# Patient Record
Sex: Female | Born: 1985 | Race: Black or African American | Hispanic: No | Marital: Single | Smoking: Never smoker
Health system: Southern US, Community
[De-identification: ages and names within clinical notes are randomized; demographics above are authoritative.]

## PROBLEM LIST (undated history)

## (undated) ENCOUNTER — Emergency Department (HOSPITAL_COMMUNITY): Admission: EM | Payer: No Typology Code available for payment source | Source: Home / Self Care

---

## 2008-04-25 ENCOUNTER — Ambulatory Visit: Payer: Self-pay | Admitting: Family Medicine

## 2008-04-25 DIAGNOSIS — J0110 Acute frontal sinusitis, unspecified: Secondary | ICD-10-CM

## 2009-06-19 ENCOUNTER — Encounter: Payer: Self-pay | Admitting: Family Medicine

## 2009-06-20 ENCOUNTER — Ambulatory Visit: Payer: Self-pay | Admitting: Family Medicine

## 2009-06-20 DIAGNOSIS — R350 Frequency of micturition: Secondary | ICD-10-CM

## 2009-06-20 LAB — CONVERTED CEMR LAB
Bilirubin Urine: NEGATIVE
Glucose, Urine, Semiquant: NEGATIVE
Specific Gravity, Urine: 1.015
Urobilinogen, UA: 0.2

## 2011-06-27 ENCOUNTER — Emergency Department (HOSPITAL_COMMUNITY)
Admission: EM | Admit: 2011-06-27 | Discharge: 2011-06-28 | Disposition: A | Discharge status: Home / Self Care | Payer: No Typology Code available for payment source | Attending: Emergency Medicine | Admitting: Emergency Medicine

## 2011-06-27 DIAGNOSIS — T148XXA Other injury of unspecified body region, initial encounter: Secondary | ICD-10-CM | POA: Insufficient documentation

## 2011-06-27 DIAGNOSIS — M25519 Pain in unspecified shoulder: Secondary | ICD-10-CM | POA: Insufficient documentation

## 2011-06-27 DIAGNOSIS — M546 Pain in thoracic spine: Secondary | ICD-10-CM | POA: Insufficient documentation

## 2012-05-22 ENCOUNTER — Encounter (HOSPITAL_COMMUNITY): Payer: Self-pay | Admitting: *Deleted

## 2012-05-22 ENCOUNTER — Emergency Department (HOSPITAL_COMMUNITY)
Admission: EM | Admit: 2012-05-22 | Discharge: 2012-05-22 | Disposition: A | Discharge status: Home / Self Care | Payer: BC Managed Care – PPO | Attending: Emergency Medicine | Admitting: Emergency Medicine

## 2012-05-22 DIAGNOSIS — R1110 Vomiting, unspecified: Secondary | ICD-10-CM | POA: Insufficient documentation

## 2012-05-22 DIAGNOSIS — R197 Diarrhea, unspecified: Secondary | ICD-10-CM

## 2012-05-22 DIAGNOSIS — R109 Unspecified abdominal pain: Secondary | ICD-10-CM | POA: Insufficient documentation

## 2012-05-22 LAB — DIFFERENTIAL
Basophils Relative: 0 % (ref 0–1)
Lymphs Abs: 0.9 10*3/uL (ref 0.7–4.0)
Monocytes Relative: 4 % (ref 3–12)
Neutro Abs: 5.5 10*3/uL (ref 1.7–7.7)
Neutrophils Relative %: 83 % — ABNORMAL HIGH (ref 43–77)

## 2012-05-22 LAB — CBC
Hemoglobin: 13 g/dL (ref 12.0–15.0)
Platelets: 290 10*3/uL (ref 150–400)
RBC: 4.87 MIL/uL (ref 3.87–5.11)
WBC: 6.6 10*3/uL (ref 4.0–10.5)

## 2012-05-22 LAB — URINALYSIS, ROUTINE W REFLEX MICROSCOPIC
Bilirubin Urine: NEGATIVE
Hgb urine dipstick: NEGATIVE
Ketones, ur: NEGATIVE mg/dL
Nitrite: NEGATIVE
pH: 7.5 (ref 5.0–8.0)

## 2012-05-22 LAB — COMPREHENSIVE METABOLIC PANEL
ALT: 12 U/L (ref 0–35)
Albumin: 4.2 g/dL (ref 3.5–5.2)
Alkaline Phosphatase: 63 U/L (ref 39–117)
BUN: 7 mg/dL (ref 6–23)
Chloride: 101 mEq/L (ref 96–112)
Glucose, Bld: 99 mg/dL (ref 70–99)
Potassium: 3.8 mEq/L (ref 3.5–5.1)
Sodium: 138 mEq/L (ref 135–145)
Total Bilirubin: 0.3 mg/dL (ref 0.3–1.2)

## 2012-05-22 LAB — LIPASE, BLOOD: Lipase: 51 U/L (ref 11–59)

## 2012-05-22 MED ORDER — ONDANSETRON 8 MG PO TBDP: 8.0000 mg | ORAL_TABLET | Freq: Three times a day (TID) | ORAL | Status: AC | PRN

## 2012-05-22 MED ORDER — SODIUM CHLORIDE 0.9 % IV SOLN
1000.0000 mL | Freq: Once | INTRAVENOUS | Status: AC
  Administered 2012-05-22: 1000 mL via INTRAVENOUS

## 2012-05-22 MED ORDER — SODIUM CHLORIDE 0.9 % IV SOLN
1000.0000 mL | INTRAVENOUS | Status: DC
  Administered 2012-05-22: 1000 mL via INTRAVENOUS

## 2012-05-22 MED ORDER — DICYCLOMINE HCL 20 MG PO TABS: 20.0000 mg | ORAL_TABLET | Freq: Four times a day (QID) | ORAL | Status: DC | PRN

## 2012-05-22 MED ORDER — ONDANSETRON HCL 4 MG/2ML IJ SOLN
4.0000 mg | Freq: Once | INTRAMUSCULAR | Status: AC
  Administered 2012-05-22: 4 mg via INTRAVENOUS
  Filled 2012-05-22: qty 2

## 2012-05-22 MED ORDER — MORPHINE SULFATE 4 MG/ML IJ SOLN
4.0000 mg | Freq: Once | INTRAMUSCULAR | Status: AC
  Administered 2012-05-22: 4 mg via INTRAVENOUS
  Filled 2012-05-22: qty 1

## 2012-05-22 NOTE — ED Provider Notes (Signed)
History     CSN: [id number]  Arrival date & time 05/22/12  1120   First MD Initiated Contact with Patient 05/22/12 1155      Chief Complaint  Patient presents with  . Nausea  . Emesis  . Diarrhea    Patient is a 26 y.o. female presenting with vomiting and diarrhea. The history is provided by the patient.  Emesis  This is a recurrent problem. The current episode started 6 to 12 hours ago. The problem occurs 5 to 10 times per day. The problem has not changed since onset.The emesis has an appearance of stomach contents. There has been no fever. Associated symptoms include abdominal pain and diarrhea. Pertinent negatives include no cough.  Diarrhea The primary symptoms include abdominal pain, vomiting and diarrhea.  The diarrhea is watery. The diarrhea occurs 5 to 10 times per day.  SHe also has been having sharp pain in her lower abdomen associated with this vomiting and diarrhea.  At times it is severe.  Kaopectate has helped.  No foreign travel.  No abx.    Pt has had similar events in the past.  She has never gone to see a doctor.  History reviewed. No pertinent past medical history.  History reviewed. No pertinent past surgical history.  No family history on file.  History  Substance Use Topics  . Smoking status: Never Smoker   . Smokeless tobacco: Not on file  . Alcohol Use: Yes     "few drinks/week"    OB History    Grav Para Term Preterm Abortions TAB SAB Ect Mult Living                  Review of Systems  Respiratory: Negative for cough.   Gastrointestinal: Positive for vomiting, abdominal pain and diarrhea.  All other systems reviewed and are negative.    Allergies  Review of patient's allergies indicates no known allergies.  Home Medications   Current Outpatient Rx  Name Route Sig Dispense Refill  . BISMUTH SUBSALICYLATE 262 MG/15ML PO SUSP Oral Take 15 mLs by mouth every 6 (six) hours as needed. For nausea or diaherra      LMP 05/01/2012  Physical  Exam  Nursing note and vitals reviewed. Constitutional: She appears well-developed and well-nourished. No distress.  HENT:  Head: Normocephalic and atraumatic.  Right Ear: External ear normal.  Left Ear: External ear normal.  Eyes: Conjunctivae are normal. Right eye exhibits no discharge. Left eye exhibits no discharge. No scleral icterus.  Neck: Neck supple. No tracheal deviation present.  Cardiovascular: Normal rate, regular rhythm and intact distal pulses.   Pulmonary/Chest: Effort normal and breath sounds normal. No stridor. No respiratory distress. She has no wheezes. She has no rales.  Abdominal: Soft. Bowel sounds are normal. She exhibits no distension. There is tenderness (mild ttp lower abdomen greater on left). There is no rebound and no guarding.  Musculoskeletal: She exhibits no edema and no tenderness.  Neurological: She is alert. She has normal strength. No sensory deficit. Cranial nerve deficit:  no gross defecits noted. She exhibits normal muscle tone. She displays no seizure activity. Coordination normal.  Skin: Skin is warm and dry. No rash noted.  Psychiatric: She has a normal mood and affect.    ED Course  Procedures (including critical care time)  Labs Reviewed  DIFFERENTIAL - Abnormal; Notable for the following:    Neutrophils Relative 83 (*)     All other components within normal limits  URINALYSIS, ROUTINE  W REFLEX MICROSCOPIC - Abnormal; Notable for the following:    APPearance CLOUDY (*)     All other components within normal limits  CBC  COMPREHENSIVE METABOLIC PANEL  LIPASE, BLOOD  POCT PREGNANCY, URINE   No results found.   1. Vomiting and diarrhea       MDM  Patient feels better after treatment with IV fluids, antiemetics and pain medications. On repeat exam she is no tenderness to palpation the lower abdomen. She describes having recurrent episodes of this type of discomfort it is possible this could be a condition such as irritable bowel. at  this time I have a low suspicion for appendicitis. Patient be discharged home with medications for nausea intestinal colic. She is to return to emergency room for fever worsening symptoms       Celene Kras, MD 05/22/12 1500

## 2012-05-22 NOTE — Discharge Instructions (Signed)
Nausea and Vomiting  Nausea is a sick feeling that often comes before throwing up (vomiting). Vomiting is a reflex where stomach contents come out of your mouth. Vomiting can cause severe loss of body fluids (dehydration). Children and elderly adults can become dehydrated quickly, especially if they also have diarrhea. Nausea and vomiting are symptoms of a condition or disease. It is important to find the cause of your symptoms.  CAUSES    Direct irritation of the stomach lining. This irritation can result from increased acid production (gastroesophageal reflux disease), infection, food poisoning, taking certain medicines (such as nonsteroidal anti-inflammatory drugs), alcohol use, or tobacco use.   Signals from the brain.These signals could be caused by a headache, heat exposure, an inner ear disturbance, increased pressure in the brain from injury, infection, a tumor, or a concussion, pain, emotional stimulus, or metabolic problems.   An obstruction in the gastrointestinal tract (bowel obstruction).   Illnesses such as diabetes, hepatitis, gallbladder problems, appendicitis, kidney problems, cancer, sepsis, atypical symptoms of a heart attack, or eating disorders.   Medical treatments such as chemotherapy and radiation.   Receiving medicine that makes you sleep (general anesthetic) during surgery.  DIAGNOSIS  Your caregiver may ask for tests to be done if the problems do not improve after a few days. Tests may also be done if symptoms are severe or if the reason for the nausea and vomiting is not clear. Tests may include:   Urine tests.   Blood tests.   Stool tests.   Cultures (to look for evidence of infection).   X-rays or other imaging studies.  Test results can help your caregiver make decisions about treatment or the need for additional tests.  TREATMENT  You need to stay well hydrated. Drink frequently but in small amounts.You may wish to drink water, sports drinks, clear broth, or eat frozen  ice pops or gelatin dessert to help stay hydrated.When you eat, eating slowly may help prevent nausea.There are also some antinausea medicines that may help prevent nausea.  HOME CARE INSTRUCTIONS    Take all medicine as directed by your caregiver.   If you do not have an appetite, do not force yourself to eat. However, you must continue to drink fluids.   If you have an appetite, eat a normal diet unless your caregiver tells you differently.   Eat a variety of complex carbohydrates (rice, wheat, potatoes, bread), lean meats, yogurt, fruits, and vegetables.   Avoid high-fat foods because they are more difficult to digest.   Drink enough water and fluids to keep your urine clear or pale yellow.   If you are dehydrated, ask your caregiver for specific rehydration instructions. Signs of dehydration may include:   Severe thirst.   Dry lips and mouth.   Dizziness.   Dark urine.   Decreasing urine frequency and amount.   Confusion.   Rapid breathing or pulse.  SEEK IMMEDIATE MEDICAL CARE IF:    You have blood or brown flecks (like coffee grounds) in your vomit.   You have black or bloody stools.   You have a severe headache or stiff neck.   You are confused.   You have severe abdominal pain.   You have chest pain or trouble breathing.   You do not urinate at least once every 8 hours.   You develop cold or clammy skin.   You continue to vomit for longer than 24 to 48 hours.   You have a fever.  MAKE SURE YOU:      Understand these instructions.   Will watch your condition.   Will get help right away if you are not doing well or get worse.  Document Released: 11/15/2005 Document Revised: 11/04/2011 Document Reviewed: 04/14/2011  [employer] Patient Information 2012 [employer].

## 2012-05-22 NOTE — ED Notes (Signed)
Pt reports n/v/d since 2am today. Unable to verbalize amt. C/o abd pain, mid and lower, aching.

## 2013-02-07 ENCOUNTER — Encounter: Payer: Self-pay | Admitting: Gastroenterology

## 2013-02-23 ENCOUNTER — Ambulatory Visit: Payer: BC Managed Care – PPO | Admitting: Gastroenterology

## 2013-11-08 ENCOUNTER — Emergency Department: Payer: Self-pay | Admitting: Emergency Medicine

## 2013-11-08 LAB — COMPREHENSIVE METABOLIC PANEL
Alkaline Phosphatase: 75 U/L
Bilirubin,Total: 0.4 mg/dL (ref 0.2–1.0)
Calcium, Total: 9.9 mg/dL (ref 8.5–10.1)
Chloride: 103 mmol/L (ref 98–107)
Creatinine: 0.91 mg/dL (ref 0.60–1.30)
Glucose: 96 mg/dL (ref 65–99)
Osmolality: 274 (ref 275–301)
SGPT (ALT): 55 U/L (ref 12–78)

## 2013-11-08 LAB — CBC WITH DIFFERENTIAL/PLATELET
Basophil #: 0 10*3/uL
Basophil %: 0.8 %
Eosinophil #: 0 10*3/uL
Eosinophil %: 0.3 %
HCT: 42.2 %
HGB: 13.8 g/dL
Lymphocyte %: 31.1 %
Lymphs Abs: 1.9 10*3/uL
MCH: 27.4 pg
MCHC: 32.6 g/dL
MCV: 84 fL
Monocyte #: 0.3 10*3/uL
Monocyte %: 4.1 %
Neutrophil #: 4 10*3/uL
Neutrophil %: 63.7 %
Platelet: 311 10*3/uL
RBC: 5.04 X10 6/mm 3
RDW: 12.5 %
WBC: 6.2 10*3/uL

## 2013-11-08 LAB — URINALYSIS, COMPLETE
Blood: NEGATIVE
Ph: 8 (ref 4.5–8.0)
Protein: NEGATIVE
WBC UR: 7 /HPF (ref 0–5)

## 2013-11-08 LAB — WET PREP, GENITAL

## 2013-11-08 LAB — LIPASE, BLOOD: Lipase: 236 U/L (ref 73–393)

## 2013-11-15 ENCOUNTER — Emergency Department: Payer: Self-pay | Admitting: Emergency Medicine

## 2013-11-15 LAB — URINALYSIS, COMPLETE
Bilirubin,UR: NEGATIVE
Blood: NEGATIVE
Glucose,UR: NEGATIVE mg/dL (ref 0–75)
Nitrite: NEGATIVE
Ph: 6 (ref 4.5–8.0)
Squamous Epithelial: 10
WBC UR: 177 /HPF (ref 0–5)

## 2014-09-09 ENCOUNTER — Other Ambulatory Visit: Payer: Self-pay | Admitting: Family Medicine

## 2014-09-09 DIAGNOSIS — R1084 Generalized abdominal pain: Secondary | ICD-10-CM

## 2014-09-24 ENCOUNTER — Other Ambulatory Visit: Payer: Self-pay | Admitting: Family Medicine

## 2014-10-10 ENCOUNTER — Other Ambulatory Visit: Payer: BC Managed Care – PPO

## 2016-01-29 ENCOUNTER — Emergency Department (HOSPITAL_COMMUNITY)
Admission: EM | Admit: 2016-01-29 | Discharge: 2016-01-29 | Disposition: A | Discharge status: Home / Self Care | Payer: BC Managed Care – PPO | Attending: Emergency Medicine | Admitting: Emergency Medicine

## 2016-01-29 ENCOUNTER — Encounter (HOSPITAL_COMMUNITY): Payer: Self-pay | Admitting: Emergency Medicine

## 2016-01-29 DIAGNOSIS — J029 Acute pharyngitis, unspecified: Secondary | ICD-10-CM | POA: Diagnosis not present

## 2016-01-29 DIAGNOSIS — R0789 Other chest pain: Secondary | ICD-10-CM | POA: Diagnosis not present

## 2016-01-29 DIAGNOSIS — R197 Diarrhea, unspecified: Secondary | ICD-10-CM | POA: Insufficient documentation

## 2016-01-29 DIAGNOSIS — R05 Cough: Secondary | ICD-10-CM | POA: Diagnosis present

## 2016-01-29 NOTE — ED Notes (Signed)
x3 no answer

## 2016-01-29 NOTE — ED Notes (Signed)
Called for pt 2x, no response 

## 2016-01-29 NOTE — ED Notes (Signed)
Pt c/o headache, rhinorrhea, sore throat, weakness, sneezing, diarrhea. Pt reports chest wall pain during cough or sneezing only, no pain at rest, no pain now.

## 2016-05-11 ENCOUNTER — Emergency Department (HOSPITAL_COMMUNITY)
Admission: EM | Admit: 2016-05-11 | Discharge: 2016-05-11 | Disposition: A | Discharge status: Home / Self Care | Payer: BC Managed Care – PPO | Source: Home / Self Care

## 2016-05-11 ENCOUNTER — Encounter (HOSPITAL_COMMUNITY): Payer: Self-pay | Admitting: *Deleted

## 2016-05-11 ENCOUNTER — Inpatient Hospital Stay (HOSPITAL_COMMUNITY)
Admission: AD | Admit: 2016-05-11 | Discharge: 2016-05-11 | Disposition: A | Discharge status: Home / Self Care | Payer: BC Managed Care – PPO | Source: Ambulatory Visit | Attending: Obstetrics and Gynecology | Admitting: Obstetrics and Gynecology

## 2016-05-11 ENCOUNTER — Encounter (HOSPITAL_COMMUNITY): Payer: Self-pay | Admitting: Emergency Medicine

## 2016-05-11 DIAGNOSIS — R1084 Generalized abdominal pain: Secondary | ICD-10-CM | POA: Diagnosis not present

## 2016-05-11 DIAGNOSIS — R109 Unspecified abdominal pain: Secondary | ICD-10-CM | POA: Diagnosis not present

## 2016-05-11 DIAGNOSIS — Z3202 Encounter for pregnancy test, result negative: Secondary | ICD-10-CM | POA: Insufficient documentation

## 2016-05-11 DIAGNOSIS — R1010 Upper abdominal pain, unspecified: Secondary | ICD-10-CM

## 2016-05-11 DIAGNOSIS — Z5321 Procedure and treatment not carried out due to patient leaving prior to being seen by health care provider: Secondary | ICD-10-CM | POA: Insufficient documentation

## 2016-05-11 LAB — COMPREHENSIVE METABOLIC PANEL
ALBUMIN: 4.2 g/dL (ref 3.5–5.0)
ALK PHOS: 70 U/L (ref 38–126)
ALT: 31 U/L (ref 14–54)
AST: 23 U/L (ref 15–41)
Anion gap: 8 (ref 5–15)
BUN: 8 mg/dL (ref 6–20)
CALCIUM: 9.4 mg/dL (ref 8.9–10.3)
CO2: 25 mmol/L (ref 22–32)
CREATININE: 0.96 mg/dL (ref 0.44–1.00)
Chloride: 101 mmol/L (ref 101–111)
GFR calc Af Amer: >60 mL/min (ref >60)
GFR calc non Af Amer: >60 mL/min (ref >60)
GLUCOSE: 109 mg/dL — AB (ref 65–99)
Potassium: 3.7 mmol/L (ref 3.5–5.1)
Sodium: 134 mmol/L — ABNORMAL LOW (ref 135–145)
Total Bilirubin: 0.6 mg/dL (ref 0.3–1.2)
Total Protein: 7.5 g/dL (ref 6.5–8.1)

## 2016-05-11 LAB — CBC WITH DIFFERENTIAL/PLATELET
BASOS ABS: 0 10*3/uL (ref 0.0–0.1)
BASOS PCT: 0 %
EOS ABS: 0.1 10*3/uL (ref 0.0–0.7)
EOS PCT: 2 %
HCT: 35.9 % — ABNORMAL LOW (ref 36.0–46.0)
Hemoglobin: 11.8 g/dL — ABNORMAL LOW (ref 12.0–15.0)
Lymphocytes Relative: 29 %
Lymphs Abs: 2.2 10*3/uL (ref 0.7–4.0)
MCH: 26.4 pg (ref 26.0–34.0)
MCHC: 32.9 g/dL (ref 30.0–36.0)
MCV: 80.3 fL (ref 78.0–100.0)
MONO ABS: 0.4 10*3/uL (ref 0.1–1.0)
Monocytes Relative: 6 %
Neutro Abs: 4.8 10*3/uL (ref 1.7–7.7)
Neutrophils Relative %: 63 %
PLATELETS: 337 10*3/uL (ref 150–400)
RBC: 4.47 MIL/uL (ref 3.87–5.11)
RDW: 13.2 % (ref 11.5–15.5)
WBC: 7.6 10*3/uL (ref 4.0–10.5)

## 2016-05-11 LAB — URINALYSIS, ROUTINE W REFLEX MICROSCOPIC
BILIRUBIN URINE: NEGATIVE
Glucose, UA: NEGATIVE mg/dL
KETONES UR: NEGATIVE mg/dL
Leukocytes, UA: NEGATIVE
NITRITE: NEGATIVE
PROTEIN: NEGATIVE mg/dL
SPECIFIC GRAVITY, URINE: 1.015 (ref 1.005–1.030)
pH: 7 (ref 5.0–8.0)

## 2016-05-11 LAB — LIPASE, BLOOD: Lipase: 40 U/L (ref 11–51)

## 2016-05-11 LAB — URINE MICROSCOPIC-ADD ON

## 2016-05-11 LAB — POCT PREGNANCY, URINE: PREG TEST UR: NEGATIVE

## 2016-05-11 LAB — AMYLASE: AMYLASE: 74 U/L (ref 28–100)

## 2016-05-11 MED ORDER — DICYCLOMINE HCL 10 MG PO CAPS
10.0000 mg | ORAL_CAPSULE | Freq: Once | ORAL | Status: AC
  Administered 2016-05-11: 10 mg via ORAL
  Filled 2016-05-11: qty 1

## 2016-05-11 MED ORDER — GI COCKTAIL ~~LOC~~
30.0000 mL | Freq: Once | ORAL | Status: AC
  Administered 2016-05-11: 30 mL via ORAL
  Filled 2016-05-11: qty 30

## 2016-05-11 NOTE — MAU Note (Addendum)
PT ARRIVED -  REMOVED  FROM CAR  - BROUGHT TO RM   5   WITH  W/C.   SAYS SHE HAS   ABD  PAIN THAT STARTED  AT 12 MN-    HAS ALSO  HAD  SAME  PAIN  1 YEAR AGO-  WENT  TO ER -   GAVE PAIN MEDS-  NO FOLLOW- UP.             NO VOMITING.     HAD  WATERY STOOL AT 10PM.     TOOK MURALAX AT  1PM        HAS  HAD 4 BM'S  TODAY  AND  LOT  OF GAS.     HAD SOME  OF  PAIN   ON Sunday NIGHT .     NO BIRTH  CONTROL.     LAST SEX-     LAST WEEK.        PT  JUST LEFT   [facility]   AT 0130

## 2016-05-11 NOTE — ED Notes (Signed)
Pt states that she has had intermittent sharp upper abdominal pain tonight. Diarrhea. Alert and oriented.

## 2016-05-11 NOTE — MAU Provider Note (Signed)
History   [id number]   Chief Complaint  Patient presents with  . Abdominal Pain    HPI Bonnie Thompson is a 30 y.o. female  Here with report of sharp abdominal pain that worsened around midnight.  Pain is rated a 9/10.  Reports having increased gas earlier in the day in which she took Miralax around 1300.  Since taking the medication has had 4 loose stools.  Denies abnormal vaginal discharge or pelvic pain.  No report of fever, body aches or chills.  Similar incident one year ago, did not receive follow-up.  Denies nausea and vomiting or bloody stools.    Patient's last menstrual period was 04/15/2016.  OB History  Gravida Para Term Preterm AB SAB TAB Ectopic Multiple Living  0 0 0 0 0 0 0 0 0 0         History reviewed. No pertinent past medical history.  History reviewed. No pertinent family history.  Social History   Social History  . Marital Status: Single    Spouse Name: N/A  . Number of Children: N/A  . Years of Education: N/A   Social History Main Topics  . Smoking status: Never Smoker   . Smokeless tobacco: None  . Alcohol Use: Yes     Comment: "few drinks/week"   LAST TIME-     SAT  . Drug Use: No  . Sexual Activity: Not Asked   Other Topics Concern  . None   Social History Narrative    No Known Allergies  No current facility-administered medications on file prior to encounter.   Current Outpatient Prescriptions on File Prior to Encounter  Medication Sig Dispense Refill  . bismuth subsalicylate (PEPTO BISMOL) 262 MG/15ML suspension Take 15 mLs by mouth every 6 (six) hours as needed. For nausea or diaherra    . dicyclomine (BENTYL) 20 MG tablet Take 1 tablet (20 mg total) by mouth every 6 (six) hours as needed (abdominal cramping). (Patient not taking: Reported on 05/11/2016) 20 tablet 0     Review of Systems  Constitutional: Negative for fever and chills.  Gastrointestinal: Positive for abdominal pain and abdominal distention. Negative for nausea,  vomiting, constipation, blood in stool, anal bleeding and rectal pain. Diarrhea: loose stools since taking Miralax.  Genitourinary: Negative for dysuria, flank pain, vaginal bleeding, vaginal discharge, vaginal pain and pelvic pain.  All other systems reviewed and are negative.    Physical Exam   Filed Vitals:   05/11/16 0200 05/11/16 0213  BP: 151/109 115/70  Pulse: 83 79  Temp: 98.5 F (36.9 C)   TempSrc: Oral   Resp: 24     Physical Exam  Constitutional: She is oriented to person, place, and time. She appears well-developed and well-nourished. No distress.  Appears uncomfortable  HENT:  Head: Normocephalic.  Neck: Normal range of motion. Neck supple.  Cardiovascular: Normal rate, regular rhythm and normal heart sounds.   Respiratory: Effort normal and breath sounds normal. No respiratory distress.  GI: She exhibits distension. She exhibits no mass. There is no hepatosplenomegaly. There is tenderness in the periumbilical area. There is no rigidity, no rebound and no guarding. No hernia. Hernia confirmed negative in the ventral area.  Musculoskeletal: Normal range of motion. She exhibits no edema.  Neurological: She is alert and oriented to person, place, and time. She has normal reflexes.  Skin: Skin is warm and dry.    MAU Course  Procedures  MDM 0245 Reviewed patient with [facility] > give  bentyl and GI cocktail and reassess; labs pending  Results for orders placed or performed during the hospital encounter of 05/11/16 (from the past 24 hour(s))  Urinalysis, Routine w reflex microscopic (not at [facility])     Status: Abnormal   Collection Time: 05/11/16  1:57 AM  Result Value Ref Range   Color, Urine YELLOW YELLOW   APPearance CLEAR CLEAR   Specific Gravity, Urine 1.015 1.005 - 1.030   pH 7.0 5.0 - 8.0   Glucose, UA NEGATIVE NEGATIVE mg/dL   Hgb urine dipstick TRACE (A) NEGATIVE   Bilirubin Urine NEGATIVE NEGATIVE   Ketones, ur NEGATIVE NEGATIVE  mg/dL   Protein, ur NEGATIVE NEGATIVE mg/dL   Nitrite NEGATIVE NEGATIVE   Leukocytes, UA NEGATIVE NEGATIVE  Urine microscopic-add on     Status: Abnormal   Collection Time: 05/11/16  1:57 AM  Result Value Ref Range   Squamous Epithelial / LPF 0-5 (A) NONE SEEN   WBC, UA 0-5 0 - 5 WBC/hpf   RBC / HPF 0-5 0 - 5 RBC/hpf   Bacteria, UA RARE (A) NONE SEEN  Pregnancy, urine POC     Status: None   Collection Time: 05/11/16  2:09 AM  Result Value Ref Range   Preg Test, Ur NEGATIVE NEGATIVE  CBC with Differential     Status: Abnormal   Collection Time: 05/11/16  2:15 AM  Result Value Ref Range   WBC 7.6 4.0 - 10.5 K/uL   RBC 4.47 3.87 - 5.11 MIL/uL   Hemoglobin 11.8 (L) 12.0 - 15.0 g/dL   HCT 91.4 (L) 78.2 - 95.6 %   MCV 80.3 78.0 - 100.0 fL   MCH 26.4 26.0 - 34.0 pg   MCHC 32.9 30.0 - 36.0 g/dL   RDW 21.3 08.6 - 57.8 %   Platelets 337 150 - 400 K/uL   Neutrophils Relative % 63 %   Neutro Abs 4.8 1.7 - 7.7 K/uL   Lymphocytes Relative 29 %   Lymphs Abs 2.2 0.7 - 4.0 K/uL   Monocytes Relative 6 %   Monocytes Absolute 0.4 0.1 - 1.0 K/uL   Eosinophils Relative 2 %   Eosinophils Absolute 0.1 0.0 - 0.7 K/uL   Basophils Relative 0 %   Basophils Absolute 0.0 0.0 - 0.1 K/uL  Comprehensive metabolic panel     Status: Abnormal   Collection Time: 05/11/16  2:15 AM  Result Value Ref Range   Sodium 134 (L) 135 - 145 mmol/L   Potassium 3.7 3.5 - 5.1 mmol/L   Chloride 101 101 - 111 mmol/L   CO2 25 22 - 32 mmol/L   Glucose, Bld 109 (H) 65 - 99 mg/dL   BUN 8 6 - 20 mg/dL   Creatinine, Ser 4.69 0.44 - 1.00 mg/dL   Calcium 9.4 8.9 - 62.9 mg/dL   Total Protein 7.5 6.5 - 8.1 g/dL   Albumin 4.2 3.5 - 5.0 g/dL   AST 23 15 - 41 U/L   ALT 31 14 - 54 U/L   Alkaline Phosphatase 70 38 - 126 U/L   Total Bilirubin 0.6 0.3 - 1.2 mg/dL   GFR calc non Af Amer >60 >60 mL/min   GFR calc Af Amer >60 >60 mL/min   Anion gap 8 5 - 15  Amylase     Status: None   Collection Time: 05/11/16  2:15 AM  Result  Value Ref Range   Amylase 74 28 - 100 U/L  Lipase, blood     Status: None  Collection Time: 05/11/16  2:15 AM  Result Value Ref Range   Lipase 40 11 - 51 U/L    0345 Pt reports pain improved after a bowel movement.  Pain rated 2/10.   Assessment and Plan  Abdominal Pain - unknown etiology  Plan: Discharge home Stool culture pending  Stool O&P pending Follow-up with PCP  Marlis Edelson, CNM 05/11/2016 2:57 AM

## 2016-05-11 NOTE — Discharge Instructions (Signed)

## 2016-05-14 LAB — OVA + PARASITE EXAM

## 2016-05-14 LAB — O&P RESULT

## 2016-05-15 LAB — STOOL CULTURE: E COLI SHIGA TOXIN ASSAY: NEGATIVE

## 2016-05-15 LAB — STOOL CULTURE REFLEX - CMPCXR

## 2016-05-15 LAB — STOOL CULTURE REFLEX - RSASHR

## 2016-06-18 ENCOUNTER — Other Ambulatory Visit: Payer: Self-pay | Admitting: Gastroenterology

## 2016-06-18 DIAGNOSIS — R109 Unspecified abdominal pain: Secondary | ICD-10-CM

## 2016-06-25 ENCOUNTER — Other Ambulatory Visit: Payer: BC Managed Care – PPO

## 2016-07-27 ENCOUNTER — Emergency Department (HOSPITAL_COMMUNITY)
Admission: EM | Admit: 2016-07-27 | Discharge: 2016-07-27 | Disposition: A | Discharge status: Home / Self Care | Payer: BC Managed Care – PPO | Attending: Emergency Medicine | Admitting: Emergency Medicine

## 2016-07-27 ENCOUNTER — Encounter (HOSPITAL_COMMUNITY): Payer: Self-pay | Admitting: Emergency Medicine

## 2016-07-27 ENCOUNTER — Emergency Department (HOSPITAL_COMMUNITY): Payer: BC Managed Care – PPO

## 2016-07-27 DIAGNOSIS — R109 Unspecified abdominal pain: Secondary | ICD-10-CM | POA: Diagnosis present

## 2016-07-27 DIAGNOSIS — R112 Nausea with vomiting, unspecified: Secondary | ICD-10-CM | POA: Insufficient documentation

## 2016-07-27 LAB — COMPREHENSIVE METABOLIC PANEL
ALT: 12 U/L — ABNORMAL LOW (ref 14–54)
AST: 18 U/L (ref 15–41)
Albumin: 4.5 g/dL (ref 3.5–5.0)
Alkaline Phosphatase: 71 U/L (ref 38–126)
Anion gap: 7 (ref 5–15)
BUN: 6 mg/dL (ref 6–20)
CHLORIDE: 104 mmol/L (ref 101–111)
CO2: 26 mmol/L (ref 22–32)
Calcium: 9.4 mg/dL (ref 8.9–10.3)
Creatinine, Ser: 0.9 mg/dL (ref 0.44–1.00)
Glucose, Bld: 100 mg/dL — ABNORMAL HIGH (ref 65–99)
POTASSIUM: 3.8 mmol/L (ref 3.5–5.1)
Sodium: 137 mmol/L (ref 135–145)
Total Bilirubin: 0.5 mg/dL (ref 0.3–1.2)
Total Protein: 8.1 g/dL (ref 6.5–8.1)

## 2016-07-27 LAB — CBC
HEMATOCRIT: 42.9 % (ref 36.0–46.0)
HEMOGLOBIN: 13.7 g/dL (ref 12.0–15.0)
MCH: 26.9 pg (ref 26.0–34.0)
MCHC: 31.9 g/dL (ref 30.0–36.0)
MCV: 84.1 fL (ref 78.0–100.0)
Platelets: 390 10*3/uL (ref 150–400)
RBC: 5.1 MIL/uL (ref 3.87–5.11)
RDW: 13.4 % (ref 11.5–15.5)
WBC: 3.7 10*3/uL — AB (ref 4.0–10.5)

## 2016-07-27 LAB — URINALYSIS, ROUTINE W REFLEX MICROSCOPIC
Bilirubin Urine: NEGATIVE
GLUCOSE, UA: NEGATIVE mg/dL
Hgb urine dipstick: NEGATIVE
Ketones, ur: NEGATIVE mg/dL
LEUKOCYTES UA: NEGATIVE
Nitrite: NEGATIVE
PH: 7.5 (ref 5.0–8.0)
Protein, ur: NEGATIVE mg/dL
SPECIFIC GRAVITY, URINE: 1.006 (ref 1.005–1.030)

## 2016-07-27 LAB — I-STAT BETA HCG BLOOD, ED (MC, WL, AP ONLY): I-stat hCG, quantitative: <5 m[IU]/mL (ref <5)

## 2016-07-27 LAB — LIPASE, BLOOD: LIPASE: 35 U/L (ref 11–51)

## 2016-07-27 MED ORDER — IOPAMIDOL (ISOVUE-300) INJECTION 61%
100.0000 mL | Freq: Once | INTRAVENOUS | Status: AC | PRN
  Administered 2016-07-27: 100 mL via INTRAVENOUS

## 2016-07-27 MED ORDER — RANITIDINE HCL 150 MG PO CAPS: 150.0000 mg | ORAL_CAPSULE | Freq: Every day | ORAL | 0 refills | Status: DC

## 2016-07-27 NOTE — ED Triage Notes (Signed)
Patient reports left-sided abdominal pain intermittently x2 years. Patient states she was seen here about a month ago for the same.  Patient states her doctor could not get her in for imaging until Friday. Her doctor sent her here to get imaging today since her pain has increased. Reports nausea/vomiting. Denies diarrhea.

## 2016-07-27 NOTE — ED Provider Notes (Signed)
[facility]-EMERGENCY DEPT Provider Note   CSN: [id number] Arrival date & time: 07/27/16  [id number]     History   Chief Complaint Chief Complaint  Patient presents with  . Abdominal Pain    HPI Bonnie Thompson is a 30 y.o. female with no significant pmhx who presents to the ED today Complaining of abdominal pain. Patient states that she has been having intermittent left-sided abdominal pain over the last year and a half. Patient states that it is typically sudden onset and is sharp in nature. Pt states that her "episodes" have been getting more severe and pain is increasing. Pts last episode was 1 month ago and she was seen and [facility] for [facility]. That time she had a lot of diarrhea and was told this was likely viral. She told her PCP who referred her to GI provider. She saw her GI provider last month he told her that he would be unable to do anything for her, she was actively having the pain at the time. Patient has been pain-free for the last month until this morning when she woke up with severe left-sided abdominal pain. She had one episode of associated nonbloody, nonbilious emesis. Patient states she called her GI doctor who scheduled her for "imaging" that was to be done on Friday. However, patient did not feel that she could wait until Friday she was instructed to come to the ED for further evaluation and additional "imaging". Patient is unsure what kind of imaging the GI provider recommended. Patient endorses a lot of belching and flatulence.  HPI  History reviewed. No pertinent past medical history.  Patient Active Problem List   Diagnosis Date Noted  . URINARY FREQUENCY 06/20/2009  . ACUTE FRONTAL SINUSITIS 04/25/2008    History reviewed. No pertinent surgical history.  OB History    Gravida Para Term Preterm AB Living   0 0 0 0 0 0   SAB TAB Ectopic Multiple Live Births   0 0 0 0         Home Medications    Prior to Admission medications   Not on File    Family  History No family history on file.  Social History Social History  Substance Use Topics  . Smoking status: Never Smoker  . Smokeless tobacco: Never Used  . Alcohol use Yes     Comment: "few drinks/week"   LAST TIME-     SAT     Allergies   Review of patient's allergies indicates no known allergies.   Review of Systems Review of Systems  All other systems reviewed and are negative.    Physical Exam Updated Vital Signs BP 139/82 (BP Location: Right Arm)   Pulse 70   Temp 98.3 F (36.8 C) (Oral)   Resp 18   Ht 5\' 4"  (1.626 m)   Wt 81.2 kg   LMP 07/18/2016 (Exact Date)   SpO2 98%   BMI 30.73 kg/m   Physical Exam  Constitutional: She is oriented to person, place, and time. She appears well-developed and well-nourished. No distress.  HENT:  Head: Normocephalic and atraumatic.  Mouth/Throat: No oropharyngeal exudate.  Eyes: Conjunctivae and EOM are normal. Pupils are equal, round, and reactive to light. Right eye exhibits no discharge. Left eye exhibits no discharge. No scleral icterus.  Cardiovascular: Normal rate, regular rhythm, normal heart sounds and intact distal pulses.  Exam reveals no gallop and no friction rub.   No murmur heard. Pulmonary/Chest: Effort normal and breath sounds normal. No  respiratory distress. She has no wheezes. She has no rales. She exhibits no tenderness.  Abdominal: Soft. Bowel sounds are normal. She exhibits no distension and no mass. There is no tenderness. There is no rebound and no guarding.  No peritoneal signs  Musculoskeletal: Normal range of motion. She exhibits no edema.  Neurological: She is alert and oriented to person, place, and time.  Skin: Skin is warm and dry. No rash noted. She is not diaphoretic. No erythema. No pallor.  Psychiatric: She has a normal mood and affect. Her behavior is normal.  Nursing note and vitals reviewed.    ED Treatments / Results  Labs (all labs ordered are listed, but only abnormal results are  displayed) Labs Reviewed  COMPREHENSIVE METABOLIC PANEL - Abnormal; Notable for the following:       Result Value   Glucose, Bld 100 (*)    ALT 12 (*)    All other components within normal limits  CBC - Abnormal; Notable for the following:    WBC 3.7 (*)    All other components within normal limits  LIPASE, BLOOD  URINALYSIS, ROUTINE W REFLEX MICROSCOPIC (NOT AT [facility])  I-STAT BETA HCG BLOOD, ED ([facility], [facility], [facility] ONLY)    EKG  EKG Interpretation None       Radiology Ct Abdomen Pelvis W Contrast  Result Date: 07/27/2016 CLINICAL DATA:  Patient reports left-sided abdominal pain intermittently x2 years.Patient states she was seen here about a month ago for the same. Patient states her doctor could not get her in for imaging until Friday. Her doctor sent her here to get imaging today since her pain has increased.Reports nausea/vomiting. Denies diarrheaNo prev abd surg's EXAM: CT ABDOMEN AND PELVIS WITH CONTRAST TECHNIQUE: Multidetector CT imaging of the abdomen and pelvis was performed using the standard protocol following bolus administration of intravenous contrast. CONTRAST:  ISOVUE-300 IOPAMIDOL (ISOVUE-300) INJECTION 61% COMPARISON:  None. FINDINGS: Lung bases:  Clear.  Heart normal size. Liver, spleen, gallbladder, pancreas, adrenal glands:  Normal. Kidneys, ureters, bladder:  Normal. Uterus and adnexa:  Unremarkable. Lymph nodes:  No adenopathy. Ascites:  None. Vascular:  Normal. Gastrointestinal: Bowel evaluation somewhat limited due to lack of oral contrast. Allowing for this, the stomach and small bowel are unremarkable. Most of the colon is decompressed. No colonic wall thickening or inflammatory changes. The appendix is within normal limits. Abdominal wall: Unremarkable. Musculoskeletal:  Normal. IMPRESSION: 1. Normal enhanced CT scan of the abdomen pelvis. Electronically Signed   By: Amie Portland M.D.   On: 07/27/2016 13:02    Procedures Procedures (including critical care  time)  Medications Ordered in ED Medications - No data to display   Initial Impression / Assessment and Plan / ED Course  I have reviewed the triage vital signs and the nursing notes.  Pertinent labs & imaging results that were available during my care of the patient were reviewed by me and considered in my medical decision making (see chart for details).  Clinical Course   Otherwise healthy 30 y.o F presents tot he Ed today c.o left sided abdominal pain that has been intermittent for over 1 year. Patient is currently followed by GI. Her last episode of pain was one month ago and she has been symptom free until this morning. She called her GI provider who scheduled her for an "imaging study" on Friday but patient did not feel she could wait until then to be evaluated. On presentation to ED, patient appears well and is nontoxic and  nonseptic appearing. Currently her abdominal pain has resolved. All vitals are stable and lab work is unremarkable. Unsure what imaging study GI was hoping to obtain, will consult. Clinically, patient appears well.  Spoke with Dr. Dulce Sellar, pts GI physician who recommends CT scan of abd. I discussed lab results with him as well. He states that if CT is negative pt may be d/c with GI follow up.  CT abdomen reveals a completely normal exam. Patient has remained pain-free while in the ED. Patient reports significant belching and flatulence. Suspect her pain is related to gas. Recommend Gas-X and Zantac to help with her symptoms. Follow-up with GI as needed.  Final Clinical Impressions(s) / ED Diagnoses   Final diagnoses:  Abdominal pain, unspecified abdominal location    New Prescriptions Discharge Medication List as of 07/27/2016  2:04 PM    START taking these medications   Details  ranitidine (ZANTAC) 150 MG capsule Take 1 capsule (150 mg total) by mouth daily., Starting Tue 07/27/2016, Print         [address] [place], PA-C 07/28/16 4098    Bethann Berkshire, MD 07/31/16 [phone]

## 2016-07-27 NOTE — Discharge Instructions (Signed)
Take zantac daily. I also recommend either Gas-X or beano as needed. Follow up with GI provider for re-evaluation. Return to the ED if you experience fevers, chills, vomiting, blood in stool, blood in vomit.

## 2016-07-30 ENCOUNTER — Other Ambulatory Visit: Payer: BC Managed Care – PPO

## 2016-08-13 ENCOUNTER — Other Ambulatory Visit: Payer: BC Managed Care – PPO

## 2016-10-05 ENCOUNTER — Other Ambulatory Visit: Payer: Self-pay | Admitting: Obstetrics and Gynecology

## 2017-01-24 ENCOUNTER — Other Ambulatory Visit (HOSPITAL_COMMUNITY): Payer: Self-pay | Admitting: Obstetrics and Gynecology

## 2017-01-24 DIAGNOSIS — N979 Female infertility, unspecified: Secondary | ICD-10-CM

## 2017-01-26 ENCOUNTER — Ambulatory Visit (HOSPITAL_COMMUNITY)
Admission: RE | Admit: 2017-01-26 | Discharge: 2017-01-26 | Disposition: A | Discharge status: Home / Self Care | Payer: BC Managed Care – PPO | Source: Ambulatory Visit | Attending: Obstetrics and Gynecology | Admitting: Obstetrics and Gynecology

## 2017-01-26 DIAGNOSIS — N979 Female infertility, unspecified: Secondary | ICD-10-CM | POA: Insufficient documentation

## 2017-01-26 MED ORDER — IOPAMIDOL (ISOVUE-300) INJECTION 61%
30.0000 mL | Freq: Once | INTRAVENOUS | Status: AC | PRN
  Administered 2017-01-26: 30 mL

## 2017-09-23 ENCOUNTER — Encounter: Payer: Self-pay | Admitting: Family Medicine

## 2017-09-23 ENCOUNTER — Ambulatory Visit (INDEPENDENT_AMBULATORY_CARE_PROVIDER_SITE_OTHER): Payer: BC Managed Care – PPO | Admitting: Family Medicine

## 2017-09-23 VITALS — BP 110/80 | HR 70 | Ht 63.0 in | Wt 182.1 lb

## 2017-09-23 DIAGNOSIS — J302 Other seasonal allergic rhinitis: Secondary | ICD-10-CM | POA: Diagnosis not present

## 2017-09-23 DIAGNOSIS — R011 Cardiac murmur, unspecified: Secondary | ICD-10-CM | POA: Diagnosis not present

## 2017-09-23 DIAGNOSIS — I499 Cardiac arrhythmia, unspecified: Secondary | ICD-10-CM | POA: Diagnosis not present

## 2017-09-23 DIAGNOSIS — R6889 Other general symptoms and signs: Secondary | ICD-10-CM | POA: Diagnosis not present

## 2017-09-23 NOTE — Progress Notes (Signed)
Patient presents to clinic today to establish care.  SUBJECTIVE: PMH: Pt is a 31 yo AAF with pmh sig for seasonal allergies.  Pt seen by [facility], Dr. Tenny Crawoss.  Pt was formerly seen by [facility].  Heart Murmur?, acute: -last wk while at OB/GYN pt told she had a heart murmur. -told to est. Care with a pcp -has never been told this before -denies CP, SOB, FHx of heart murmurs, does not recall being told about any issues at birth.  Seasonal allergies: -on going hx -takes zyrtec and flonase occasionally -States has had to switch po allergy med as it stopped working.  Sensitivity to light: -Notices more while driving at night -Patient endorses seeing halos around objects -Seen by Optometry -New Rx for glasses to arrive next week.   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  She is single.  She does not use tobacco, EtOH, or drugs.  Family medical history: Mom-AAW Dad-AAW Sister-Lujan, AAW Brother-Jeremy, AAW Brother-Cameron, AAW MGM-AAW MGF-AAW PGM-desc, DM PGF- alive, MI   Health Maintenance: PAP -- last wk  History reviewed. No pertinent past medical history.  History reviewed. No pertinent surgical history.  No current outpatient prescriptions on file prior to visit.   No current facility-administered medications on file prior to visit.     No Known Allergies  History reviewed. No pertinent family history.  Social History   Social History  . Marital status: Single    Spouse name: N/A  . Number of children: N/A  . Years of education: N/A   Occupational History  . Not on file.   Social History Main Topics  . Smoking status: Never Smoker  . Smokeless tobacco: Never Used  . Alcohol use Yes     Comment: "few drinks/week"   LAST TIME-     SAT  . Drug use: No  . Sexual activity: Not on file   Other Topics Concern  . Not on file   Social History Narrative  . No narrative on  file    ROS General: Denies fever, chills, night sweats, changes in weight, changes in appetite HEENT: Denies headaches, ear pain, changes in vision, rhinorrhea, sore throat   + eyes--sensitivity to light especially at night  CV: Denies CP, palpitations, SOB, orthopnea Pulm: Denies SOB, cough, wheezing GI: Denies abdominal pain, nausea, vomiting, diarrhea, constipation GU: Denies dysuria, hematuria, frequency, vaginal discharge Msk: Denies muscle cramps, joint pains Neuro: Denies weakness, numbness, tingling Skin: Denies rashes, bruising Psych: Denies depression, anxiety, hallucinations  BP 110/80 (BP Location: Right Arm, Patient Position: Sitting, Cuff Size: Normal)   Pulse 70   Ht 5\' 3"  (1.6 m)   Wt 182 lb 1.6 oz (82.6 kg)   LMP 07/30/2017   BMI 32.26 kg/m   Physical Exam Gen. Pleasant, well developed, well-nourished, in NAD HEENT - [place]/AT, PERRL-some discomfort noted when light shinned in eyes, no scleral icterus, no nasal drainage, pharynx without erythema or exudate. Lungs: no use of accessory muscles, no dullness to percussion, CTAB, no wheezes, rales or rhonchi Cardiovascular: RRR, S1/6 murmur. no peripheral edema.  A few irregular beats heard during ascultation.  EKG done. Abdomen: BS present, soft, nontender,nondistended, no hepatosplenomegaly Musculoskeletal: No deformities, moves all four extremities, no cyanosis or clubbing, normal tone Neuro:  A&Ox3, CN II-XII intact, normal gait Skin:  Warm, dry, intact, no lesions Psych: normal affect, mood appropriate   No results found for this or any previous  visit (from the past 2160 hour(s)).  Assessment/Plan: Cardiac arrhythmia, unspecified cardiac arrhythmia type  -Irregular beat noted on exam -EKG obtained and reviewed.  Sinus rhythm with bradycardia noted- HR 58, normal R wave progression - Plan: EKG 12-Lead  Murmur, cardiac - Plan: ECHOCARDIOGRAM COMPLETE  Seasonal allergies -Continue Zyrtec and  flonase -instructed on proper nasal spray use -advised to use medications daily  Light sensitivity  -continue wearing glasses as needed - Plan: Ambulatory referral to Ophthalmology    F/u in next few months prn

## 2017-09-23 NOTE — Patient Instructions (Signed)
Allergies An allergy is when your body reacts to a substance in a way that is not normal. An allergic reaction can happen after you:  Eat something.  Breathe in something.  Touch something.  You can be allergic to:  Things that are only around during certain seasons, like molds and pollens.  Foods.  Drugs.  Insects.  Animal dander.  What are the signs or symptoms?  Puffiness (swelling). This may happen on the lips, face, tongue, mouth, or throat.  Sneezing.  Coughing.  Breathing loudly (wheezing).  Stuffy nose.  Tingling in the mouth.  A rash.  Itching.  Itchy, red, puffy areas of skin (hives).  Watery eyes.  Throwing up (vomiting).  Watery poop (diarrhea).  Dizziness.  Feeling faint or fainting.  Trouble breathing or swallowing.  A tight feeling in the chest.  A fast heartbeat. How is this diagnosed? Allergies can be diagnosed with:  A medical and family history.  Skin tests.  Blood tests.  A food diary. A food diary is a record of all the foods, drinks, and symptoms you have each day.  The results of an elimination diet. This diet involves making sure not to eat certain foods and then seeing what happens when you start eating them again.  How is this treated? There is no cure for allergies, but allergic reactions can be treated with medicine. Severe reactions usually need to be treated at a hospital. How is this prevented? The best way to prevent an allergic reaction is to avoid the thing you are allergic to. Allergy shots and medicines can also help prevent reactions in some cases. This information is not intended to replace advice given to you by your health care provider. Make sure you discuss any questions you have with your health care provider. Document Released: 03/12/2013 Document Revised: 07/12/2016 Document Reviewed: 08/27/2014 Elsevier Interactive Patient Education  2018 Elsevier Inc.  Heart Murmur A heart murmur is an extra  sound that is caused by chaotic blood flow. The murmur can be heard as a "hum" or "whoosh" sound when blood flows through the heart. The heart has four areas called chambers. Valves separate the upper and lower chambers from each other (tricuspid valve and mitral valve) and separate the lower chambers of the heart from pathways that lead away from the heart (aortic valve and pulmonary valve). Normally, the valves open to let blood flow through or out of your heart, and then they shut to keep the blood from flowing backward. There are two types of heart murmurs:  Innocent murmurs. Most people with this type of heart murmur do not have a heart problem. Many children have innocent heart murmurs. Your health care provider may suggest some basic testing to find out whether your murmur is an innocent murmur. If an innocent heart murmur is found, there is no need for further tests or treatment and no need to restrict activities or stop playing sports.  Abnormal murmurs. These types of murmurs can occur in children and adults. Abnormal murmurs may be a sign of a more serious heart condition, such as a heart defect present at birth (congenital defect) or heart valve disease.  What are the causes? This condition is caused by heart valves that are not working properly. In children, abnormal heart murmurs are typically caused by congenital defects. In adults, abnormal murmurs are usually from heart valve problems caused by disease, infection, or aging. Three types of heart valve defects can cause a murmur:  Regurgitation. This is  when blood leaks back through the valve in the wrong direction.  Mitral valve prolapse. This is when the mitral valve of the heart has a loose flap and does not close tightly.  Stenosis. This is when a valve does not open enough and blocks blood flow.  This condition may also be caused by:  Pregnancy.  Fever.  Overactive thyroid gland.  Anemia.  Exercise.  Rapid growth  spurts (in children).  What are the signs or symptoms? Innocent murmurs do not cause symptoms, and many people with abnormal murmurs may or may not have symptoms. If symptoms do develop, they may include:  Shortness of breath.  Blue coloring of the skin, especially on the fingertips.  Chest pain.  Palpitations, or feeling a fluttering or skipped heartbeat.  Fainting.  Persistent cough.  Getting tired much faster than expected.  Swelling in the abdomen, feet, or ankles.  How is this diagnosed? This condition may be diagnosed during a routine physical or other exam. If your health care provider hears a murmur with a stethoscope, he or she will listen for:  Where the murmur is located in your heart.  How long the murmur lasts (duration).  When the murmur is heard during the heartbeat.  How loud the murmur is. This may help the health care provider figure out what is causing the murmur.  You may be referred to a heart specialist (cardiologist). You may also have other tests, including:  Electrocardiogram (ECG or EKG). This test measures the electrical activity of your heart.  Echocardiogram. This test uses high frequency sound waves to make pictures of your heart.  MRI or chest X-ray.  Cardiac catheterization. This test looks at blood flow through the heart.  For children and adults who have an abnormal heart murmur and want to stay active, it is important to complete testing, review test results, and receive recommendations from your health care provider. If heart disease is present, it may not be safe to play or be active. How is this treated? Heart murmurs themselves do not need treatment. In some cases, a heart murmur may go away on its own. If an underlying problem or disease is causing the murmur, you may need treatment. If treatment is needed, it will depend on the type and severity of the disease or heart problem causing the murmur. Treatment may  include:  Medicine.  Surgery.  Dietary and lifestyle changes.  Follow these instructions at home:  Talk with your health care provider before participating in sports or other activities that require a lot of effort and energy (are strenuous).  Learn as much as possible about your condition and any related diseases. Ask your health care provider if you may at risk for any medical emergencies.  Talk with your health care provider about what symptoms you should look out for.  It is up to you to get your test results. Ask your health care provider, or the department that is doing the test, when your results will be ready.  Keep all follow-up visits as told by your health care provider. This is important. Contact a health care provider if:  You feel light-headed.  You are frequently short of breath.  You feel more tired than usual.  You are having a hard time keeping up with normal activities or fitness routines.  You have swelling in your ankles or feet.  You have chest pain.  You notice that your heart often beats irregularly.  You develop any new symptoms. Get  help right away if:  You develop severe chest pain.  You are having trouble breathing.  You have fainting spells.  Your symptoms suddenly get worse. These symptoms may represent a serious problem that is an emergency. Do not wait to see if the symptoms will go away. Get medical help right away. Call your local emergency services (911 in the [place] Do not drive yourself to the hospital. Summary  Normally, the heart valves open to let blood flow through or out of your heart, and then they shut to keep the blood from flowing backward.  Heart murmur is caused by heart valves that are not working properly.  You may need treatment if an underlying problem or disease is causing the heart murmur. Treatment may include medicine, surgery, or dietary and lifestyle changes.  Talk with your health care provider before  participating in sports or other activities that require a lot of effort and energy (are strenuous).  Talk with your health care provider about what symptoms you should watch out for. This information is not intended to replace advice given to you by your health care provider. Make sure you discuss any questions you have with your health care provider. Document Released: 12/23/2004 Document Revised: 11/03/2016 Document Reviewed: 11/03/2016 Elsevier Interactive Patient Education  2017 [employer]

## 2017-10-03 ENCOUNTER — Ambulatory Visit (HOSPITAL_COMMUNITY): Payer: BC Managed Care – PPO | Attending: Cardiology

## 2017-10-03 ENCOUNTER — Other Ambulatory Visit: Payer: Self-pay

## 2017-10-03 DIAGNOSIS — R011 Cardiac murmur, unspecified: Secondary | ICD-10-CM | POA: Diagnosis not present

## 2017-10-03 DIAGNOSIS — I371 Nonrheumatic pulmonary valve insufficiency: Secondary | ICD-10-CM | POA: Insufficient documentation

## 2017-10-03 DIAGNOSIS — I071 Rheumatic tricuspid insufficiency: Secondary | ICD-10-CM | POA: Insufficient documentation

## 2017-10-03 LAB — ECHOCARDIOGRAM COMPLETE
AVLVOTPG: 3 mmHg
Ao-asc: 27 cm
CHL CUP STROKE VOLUME: 56 mL
EERAT: 6.68
EWDT: 313 ms
FS: 37 % (ref 28–44)
IVS/LV PW RATIO, ED: 0.99
LA ID, A-P, ES: 34 mm
LA diam index: 1.75 cm/m2
LA vol index: 29.8 mL/m2
LA vol: 58 mL
LAVOLA4C: 58 mL
LEFT ATRIUM END SYS DIAM: 34 mm
LV E/e' medial: 6.68
LV E/e'average: 6.68
LV SIMPSON'S DISK: 62
LV TDI E'LATERAL: 11.3
LV dias vol: 90 mL (ref 46–106)
LV e' LATERAL: 11.3 cm/s
LV sys vol index: 17 mL/m2
LVDIAVOLIN: 46 mL/m2
LVOT SV: 65 mL
LVOT VTI: 20.7 cm
LVOT area: 3.14 cm2
LVOT diameter: 20 mm
LVOT peak vel: 88 cm/s
LVSYSVOL: 34 mL (ref 14–42)
MV Dec: 313
MV Peak grad: 2 mmHg
MVPKAVEL: 48.9 m/s
MVPKEVEL: 75.5 m/s
PW: 10.5 mm — AB (ref 0.6–1.1)
RV LATERAL S' VELOCITY: 14.2 cm/s
RV sys press: 19 mmHg
Reg peak vel: 200 cm/s
TDI e' medial: 8.19
TR max vel: 200 cm/s

## 2018-01-06 ENCOUNTER — Other Ambulatory Visit: Payer: Self-pay

## 2018-01-06 ENCOUNTER — Ambulatory Visit: Payer: BC Managed Care – PPO | Admitting: Family Medicine

## 2018-01-06 ENCOUNTER — Encounter: Payer: Self-pay | Admitting: Family Medicine

## 2018-01-06 VITALS — BP 110/70 | HR 96 | Temp 99.7°F | Resp 18 | Ht 63.27 in | Wt 177.0 lb

## 2018-01-06 DIAGNOSIS — R6889 Other general symptoms and signs: Secondary | ICD-10-CM | POA: Diagnosis not present

## 2018-01-06 DIAGNOSIS — Z0289 Encounter for other administrative examinations: Secondary | ICD-10-CM

## 2018-01-06 DIAGNOSIS — R52 Pain, unspecified: Secondary | ICD-10-CM

## 2018-01-06 LAB — POCT INFLUENZA A/B
Influenza A, POC: NEGATIVE
Influenza B, POC: NEGATIVE

## 2018-01-06 NOTE — Progress Notes (Signed)
   2/8/201911:51 AM  Cheri RousAshley Blankenbaker 01/03/1986, 32 y.o. female [id number]  Chief Complaint  Patient presents with  . Generalized Body Aches    X 1 day  . Chills    X 1 day- with headache and fever  . Diarrhea    X 1 day    HPI:   Patient is a 32 y.o. female who presents today for 1 day of fever, chills, body aches, headaches, cough and diarrhea. No vomiting, ear pain, sore throat, SOB. Tolerating PO well, normal UOP. Did not have flu vaccine this season.  Depression screen PHQ 2/9 01/06/2018  Decreased Interest 0  Down, Depressed, Hopeless 0  PHQ - 2 Score 0    No Known Allergies  Prior to Admission medications   Not on File    History reviewed. No pertinent past medical history.  History reviewed. No pertinent surgical history.  Social History   Tobacco Use  . Smoking status: Never Smoker  . Smokeless tobacco: Never Used  Substance Use Topics  . Alcohol use: Yes    Alcohol/week: 3.0 oz    Types: 5 Glasses of wine per week    History reviewed. No pertinent family history.  ROS Per hpi  OBJECTIVE:  Blood pressure 110/70, pulse 96, temperature 99.7 F (37.6 C), temperature source Oral, resp. rate 18, height 5' 3.27" (1.607 m), weight 177 lb (80.3 kg), last menstrual period 01/02/2018, SpO2 95 %.  Physical Exam  Constitutional: She is oriented to person, place, and time and well-developed, well-nourished, and in no distress. She appears not dehydrated. She has a sickly appearance.  HENT:  Head: Normocephalic and atraumatic.  Right Ear: Hearing, tympanic membrane, external ear and ear canal normal.  Left Ear: Hearing, tympanic membrane, external ear and ear canal normal.  Mouth/Throat: Oropharynx is clear and moist.  Eyes: EOM are normal. Pupils are equal, round, and reactive to light.  Neck: Neck supple.  Cardiovascular: Normal rate, regular rhythm and normal heart sounds. Exam reveals no gallop and no friction rub.  No murmur heard. Pulmonary/Chest: Effort  normal and breath sounds normal. She has no wheezes. She has no rales.  Abdominal: Soft. Bowel sounds are normal. She exhibits no distension. There is no tenderness.  Lymphadenopathy:    She has no cervical adenopathy.  Neurological: She is alert and oriented to person, place, and time. Gait normal.  Skin: Skin is warm and dry.      Results for orders placed or performed in visit on 01/06/18 (from the past 24 hour(s))  POCT Influenza A/B     Status: None   Collection Time: 01/06/18 11:32 AM  Result Value Ref Range   Influenza A, POC Negative Negative   Influenza B, POC Negative Negative     ASSESSMENT and PLAN  1. Flu-like symptoms Discussed supportive measures for: increase hydration, rest, OTC medications, etc. RTC precautions discussed . 2. Generalized body aches - POCT Influenza A/B  Return if symptoms worsen or fail to improve.    Myles LippsIrma M Santiago, MD Primary Care at [facility] [address] [place], [place] [address] Ph.  [phone] Fax [phone]

## 2018-01-06 NOTE — Patient Instructions (Addendum)
     IF you received an x-ray today, you will receive an invoice from [facility]. Please contact [facility] at [phone] with questions or concerns regarding your invoice.   IF you received labwork today, you will receive an invoice from [facility]. Please contact [facility] at [phone] with questions or concerns regarding your invoice.   Our billing staff will not be able to assist you with questions regarding bills from these companies.  You will be contacted with the lab results as soon as they are available. The fastest way to get your results is to activate your My Chart account. Instructions are located on the last page of this paperwork. If you have not heard from us regarding the results in 2 weeks, please contact this office.      Influenza, Adult Influenza ("the flu") is an infection in the lungs, nose, and throat (respiratory tract). It is caused by a virus. The flu causes many common cold symptoms, as well as a high fever and body aches. It can make you feel very sick. The flu spreads easily from person to person (is contagious). Getting a flu shot (influenza vaccination) every year is the best way to prevent the flu. Follow these instructions at home:  Take over-the-counter and prescription medicines only as told by your doctor.  Use a cool mist humidifier to add moisture (humidity) to the air in your home. This can make it easier to breathe.  Rest as needed.  Drink enough fluid to keep your pee (urine) clear or pale yellow.  Cover your mouth and nose when you cough or sneeze.  Wash your hands with soap and water often, especially after you cough or sneeze. If you cannot use soap and water, use hand sanitizer.  Stay home from work or school as told by your doctor. Unless you are visiting your doctor, try to avoid leaving home until your fever has been gone for 24 hours without the use of medicine.  Keep all follow-up visits as told by your doctor.  This is important. How is this prevented?  Getting a yearly (annual) flu shot is the best way to avoid getting the flu. You may get the flu shot in late summer, fall, or winter. Ask your doctor when you should get your flu shot.  Wash your hands often or use hand sanitizer often.  Avoid contact with people who are sick during cold and flu season.  Eat healthy foods.  Drink plenty of fluids.  Get enough sleep.  Exercise regularly. Contact a doctor if:  You get new symptoms.  You have:  Chest pain.  Watery poop (diarrhea).  A fever.  Your cough gets worse.  You start to have more mucus.  You feel sick to your stomach (nauseous).  You throw up (vomit). Get help right away if:  You start to be short of breath or have trouble breathing.  Your skin or nails turn a bluish color.  You have very bad pain or stiffness in your neck.  You get a sudden headache.  You get sudden pain in your face or ear.  You cannot stop throwing up. This information is not intended to replace advice given to you by your health care provider. Make sure you discuss any questions you have with your health care provider. Document Released: 08/24/2008 Document Revised: 04/22/2016 Document Reviewed: 09/09/2015 Elsevier Interactive Patient Education  2017 Elsevier Inc.  

## 2018-01-09 ENCOUNTER — Encounter: Payer: Self-pay | Admitting: *Deleted

## 2018-01-09 ENCOUNTER — Encounter: Payer: Self-pay | Admitting: Family Medicine

## 2018-01-09 ENCOUNTER — Ambulatory Visit: Payer: BC Managed Care – PPO | Admitting: Physician Assistant

## 2018-01-09 ENCOUNTER — Encounter: Payer: Self-pay | Admitting: Physician Assistant

## 2018-01-09 VITALS — BP 138/83 | HR 74 | Temp 98.5°F | Resp 16 | Ht 63.0 in | Wt 180.0 lb

## 2018-01-09 DIAGNOSIS — R197 Diarrhea, unspecified: Secondary | ICD-10-CM | POA: Diagnosis not present

## 2018-01-09 LAB — POCT CBC
Granulocyte percent: 59.3 %G (ref 37–80)
HCT, POC: 39.5 % (ref 37.7–47.9)
HEMOGLOBIN: 12.6 g/dL (ref 12.2–16.2)
LYMPH, POC: 1.3 (ref 0.6–3.4)
MCH: 26.4 pg — AB (ref 27–31.2)
MCHC: 31.8 g/dL (ref 31.8–35.4)
MCV: 83 fL (ref 80–97)
MID (cbc): 0.3 (ref 0–0.9)
MPV: 7.1 fL (ref 0–99.8)
PLATELET COUNT, POC: 357 10*3/uL (ref 142–424)
POC Granulocyte: 2.4 (ref 2–6.9)
POC LYMPH PERCENT: 32.1 %L (ref 10–50)
POC MID %: 8.6 % (ref 0–12)
RBC: 4.75 M/uL (ref 4.04–5.48)
RDW, POC: 14.2 %
WBC: 4 10*3/uL — AB (ref 4.6–10.2)

## 2018-01-09 LAB — POCT URINALYSIS DIP (MANUAL ENTRY)
BILIRUBIN UA: NEGATIVE
GLUCOSE UA: NEGATIVE mg/dL
Leukocytes, UA: NEGATIVE
Nitrite, UA: NEGATIVE
Protein Ur, POC: 30 mg/dL — AB
SPEC GRAV UA: 1.02 (ref 1.010–1.025)
UROBILINOGEN UA: 2 U/dL — AB
pH, UA: 8.5 — AB (ref 5.0–8.0)

## 2018-01-09 NOTE — Progress Notes (Signed)
[facility] AT [facility] [address], [place] [phone]  Date:  01/09/2018   Name:  Bonnie Thompson   DOB:  07/30/1986   MRN:  [id number]  PCP:  Patient, No Pcp Per    History of Present Illness:  Bonnie Thompson is a 32 y.o. female patient who presents to PCP with  Chief Complaint  Patient presents with  . Generalized Body Aches  . Chills    pt not feeling better from friday, pt was seen 01/06/18  . Diarrhea    pt states she feels better     5 days ago, bodyaches and chills with malaise.  The next day, she had subjective fever chills.  Temperature 102.8.  She went to be seen 3 days ago.  Negative influenza.  Rest precautions given.  She has not had any improvement.  She will be fine on medications--ibuprofen, tylenol and theraflu.   No nasal congestion sore throat or cough.  She can note no UR symptoms at this time. She has frequency.  But no hematuria or dysuria.   She has no abdominal pain.   Last temperature this morning 100.  She has less diarrhea Non-bloody or black stool\ No foreign travel.   She has diarrhea, but notes the number episodes have decreased and now they are just loose stool.   Patient's last menstrual period was 01/02/2018 (exact date). She continues to have some bleeding  Patient Active Problem List   Diagnosis Date Noted  . URINARY FREQUENCY 06/20/2009  . ACUTE FRONTAL SINUSITIS 04/25/2008    No past medical history on file.  No past surgical history on file.  Social History   Tobacco Use  . Smoking status: Never Smoker  . Smokeless tobacco: Never Used  Substance Use Topics  . Alcohol use: Yes    Alcohol/week: 3.0 oz    Types: 5 Glasses of wine per week    Comment: "few drinks/week"   LAST TIME-     SAT  . Drug use: No    No family history on file.  No Known Allergies  Medication list has been reviewed and updated.  Current Outpatient Medications on File Prior to Visit  Medication Sig Dispense Refill  .  Cetirizine HCl (ZYRTEC ALLERGY PO) Zyrtec    . fluticasone (FLONASE) 50 MCG/ACT nasal spray fluticasone 50 mcg/actuation nasal spray,suspension     No current facility-administered medications on file prior to visit.     ROS ROS otherwise unremarkable unless listed above.  Physical Examination: BP 138/83   Pulse 74   Temp 98.5 F (36.9 C) (Oral)   Resp 16   Ht 5\' 3"  (1.6 m)   Wt 180 lb (81.6 kg)   LMP 01/02/2018 (Exact Date)   SpO2 97%   BMI 31.89 kg/m  Ideal Body Weight: Weight in (lb) to have BMI = 25: 140.8  Physical Exam  Constitutional: She is oriented to person, place, and time. She appears well-developed and well-nourished. No distress.  HENT:  Head: Normocephalic and atraumatic.  Right Ear: External ear normal.  Left Ear: External ear normal.  Eyes: Conjunctivae and EOM are normal. Pupils are equal, round, and reactive to light.  Cardiovascular: Normal rate.  Pulmonary/Chest: Effort normal. No respiratory distress.  Abdominal: Soft. Normal appearance and bowel sounds are normal. There is no tenderness.  Neurological: She is alert and oriented to person, place, and time.  Skin: She is not diaphoretic.  Psychiatric: She has a normal mood and affect. Her  behavior is normal.    Results for orders placed or performed in visit on 01/09/18  POCT urinalysis dipstick  Result Value Ref Range   Color, UA yellow yellow   Clarity, UA clear clear   Glucose, UA negative negative mg/dL   Bilirubin, UA negative negative   Ketones, POC UA trace (5) (A) negative mg/dL   Spec Grav, UA 1.610 9.604 - 1.025   Blood, UA large (A) negative   pH, UA 8.5 (A) 5.0 - 8.0   Protein Ur, POC =30 (A) negative mg/dL   Urobilinogen, UA 2.0 (A) 0.2 or 1.0 E.U./dL   Nitrite, UA Negative Negative   Leukocytes, UA Negative Negative  POCT CBC  Result Value Ref Range   WBC 4.0 (A) 4.6 - 10.2 K/uL   Lymph, poc 1.3 0.6 - 3.4   POC LYMPH PERCENT 32.1 10 - 50 %L   MID (cbc) 0.3 0 - 0.9   POC MID  % 8.6 0 - 12 %M   POC Granulocyte 2.4 2 - 6.9   Granulocyte percent 59.3 37 - 80 %G   RBC 4.75 4.04 - 5.48 M/uL   Hemoglobin 12.6 12.2 - 16.2 g/dL   HCT, POC 54.0 98.1 - 47.9 %   MCV 83.0 80 - 97 fL   MCH, POC 26.4 (A) 27 - 31.2 pg   MCHC 31.8 31.8 - 35.4 g/dL   RDW, POC 19.1 %   Platelet Count, POC 357 142 - 424 K/uL   MPV 7.1 0 - 99.8 fL    Assessment and Plan: Bonnie Thompson is a 32 y.o. female who is here today for cc of  Chief Complaint  Patient presents with  . Generalized Body Aches  . Chills    pt not feeling better from friday, pt was seen 01/06/18  . Diarrhea    pt states she feels better  appears to be gastroenteritis.  Likely of viral etiology. Advised loperamide use at this time, and diarrhea friendly diet. Work note given Blood likely secondary to menses. Will return in 3 days if sxs do not improve.  Obtain stool culture and further work up. Diarrhea, unspecified type - Plan: POCT urinalysis dipstick, POCT CBC, CANCELED: CBC  Trena Platt, PA-C [facility] 2/12/20199:59 AM

## 2018-01-09 NOTE — Patient Instructions (Addendum)
This is resolving. I would like you to take loperamide over the counter at this time.  Continue to take ibuprofen or tylenol for the fevers. Please follow the diarrhea diet below.    Viral Gastroenteritis, Adult Viral gastroenteritis is also known as the stomach flu. This condition is caused by certain germs (viruses). These germs can be passed from person to person very easily (are very contagious). This condition can cause sudden watery poop (diarrhea), fever, and throwing up (vomiting). Having watery poop and throwing up can make you feel weak and cause you to get dehydrated. Dehydration can make you tired and thirsty, make you have a dry mouth, and make it so you pee (urinate) less often. Older adults and people with other diseases or a weak defense system (immune system) are at higher risk for dehydration. It is important to replace the fluids that you lose from having watery poop and throwing up. Follow these instructions at home: Follow instructions from your doctor about how to care for yourself at home. Eating and drinking  Follow these instructions as told by your doctor:  Take an oral rehydration solution (ORS). This is a drink that is sold at pharmacies and stores.  Drink clear fluids in small amounts as you are able, such as: ? Water. ? Ice chips. ? Diluted fruit juice. ? Low-calorie sports drinks.  Eat bland, easy-to-digest foods in small amounts as you are able, such as: ? Bananas. ? Applesauce. ? Rice. ? Low-fat (lean) meats. ? Toast. ? Crackers.  Avoid fluids that have a lot of sugar or caffeine in them.  Avoid alcohol.  Avoid spicy or fatty foods.  General instructions  Drink enough fluid to keep your pee (urine) clear or pale yellow.  Wash your hands often. If you cannot use soap and water, use hand sanitizer.  Make sure that all people in your home wash their hands well and often.  Rest at home while you get better.  Take over-the-counter and  prescription medicines only as told by your doctor.  Watch your condition for any changes.  Take a warm bath to help with any burning or pain from having watery poop.  Keep all follow-up visits as told by your doctor. This is important. Contact a doctor if:  You cannot keep fluids down.  Your symptoms get worse.  You have new symptoms.  You feel light-headed or dizzy.  You have muscle cramps. Get help right away if:  You have chest pain.  You feel very weak or you pass out (faint).  You see blood in your throw-up.  Your throw-up looks like coffee grounds.  You have bloody or black poop (stools) or poop that look like tar.  You have a very bad headache, a stiff neck, or both.  You have a rash.  You have very bad pain, cramping, or bloating in your belly (abdomen).  You have trouble breathing.  You are breathing very quickly.  Your heart is beating very quickly.  Your skin feels cold and clammy.  You feel confused.  You have pain when you pee.  You have signs of dehydration, such as: ? Dark pee, hardly any pee, or no pee. ? Cracked lips. ? Dry mouth. ? Sunken eyes. ? Sleepiness. ? Weakness. This information is not intended to replace advice given to you by your health care provider. Make sure you discuss any questions you have with your health care provider. Document Released: 05/03/2008 Document Revised: 06/04/2016 Document Reviewed: 07/22/2015 Elsevier Interactive  Patient Education  2017 [employer]   Diarrhea, Adult Diarrhea is when you have loose and water poop (stool) often. Diarrhea can make you feel weak and cause you to get dehydrated. Dehydration can make you tired and thirsty, make you have a dry mouth, and make it so you pee (urinate) less often. Diarrhea often lasts 2-3 days. However, it can last longer if it is a sign of something more serious. It is important to treat your diarrhea as told by your doctor. Follow these instructions at  home: Eating and drinking  Follow these recommendations as told by your doctor:  Take an oral rehydration solution (ORS). This is a drink that is sold at pharmacies and stores.  Drink clear fluids, such as: ? Water. ? Ice chips. ? Diluted fruit juice. ? Low-calorie sports drinks.  Eat bland, easy-to-digest foods in small amounts as you are able. These foods include: ? Bananas. ? Applesauce. ? Rice. ? Low-fat (lean) meats. ? Toast. ? Crackers.  Avoid drinking fluids that have a lot of sugar or caffeine in them.  Avoid alcohol.  Avoid spicy or fatty foods.  General instructions   Drink enough fluid to keep your pee (urine) clear or pale yellow.  Wash your hands often. If you cannot use soap and water, use hand sanitizer.  Make sure that all people in your home wash their hands well and often.  Take over-the-counter and prescription medicines only as told by your doctor.  Rest at home while you get better.  Watch your condition for any changes.  Take a warm bath to help with any burning or pain from having diarrhea.  Keep all follow-up visits as told by your doctor. This is important. Contact a doctor if:  You have a fever.  Your diarrhea gets worse.  You have new symptoms.  You cannot keep fluids down.  You feel light-headed or dizzy.  You have a headache.  You have muscle cramps. Get help right away if:  You have chest pain.  You feel very weak or you pass out (faint).  You have bloody or black poop or poop that look like tar.  You have very bad pain, cramping, or bloating in your belly (abdomen).  You have trouble breathing or you are breathing very quickly.  Your heart is beating very quickly.  Your skin feels cold and clammy.  You feel confused.  You have signs of dehydration, such as: ? Dark pee, hardly any pee, or no pee. ? Cracked lips. ? Dry mouth. ? Sunken eyes. ? Sleepiness. ? Weakness. This information is not intended to  replace advice given to you by your health care provider. Make sure you discuss any questions you have with your health care provider. Document Released: 05/03/2008 Document Revised: 06/04/2016 Document Reviewed: 07/22/2015 Elsevier Interactive Patient Education  2018 [employer].    IF you received an x-ray today, you will receive an invoice from [facility]. Please contact [facility] at [phone] with questions or concerns regarding your invoice.   IF you received labwork today, you will receive an invoice from [facility]. Please contact [facility] at [phone] with questions or concerns regarding your invoice.   Our billing staff will not be able to assist you with questions regarding bills from these companies.  You will be contacted with the lab results as soon as they are available. The fastest way to get your results is to activate your My Chart account. Instructions are located on the last page of  this paperwork. If you have not heard from [place] regarding the results in 2 weeks, please contact this office.

## 2018-01-09 NOTE — Telephone Encounter (Signed)
Patient seeking an appointment. This encounter was created in error - please disregard.

## 2018-01-10 ENCOUNTER — Encounter: Payer: Self-pay | Admitting: Physician Assistant

## 2018-01-11 ENCOUNTER — Ambulatory Visit: Payer: BC Managed Care – PPO | Admitting: Family Medicine

## 2018-01-11 ENCOUNTER — Encounter: Payer: Self-pay | Admitting: Family Medicine

## 2018-01-11 VITALS — BP 122/80 | HR 76 | Temp 99.0°F | Resp 18 | Ht 64.0 in | Wt 177.6 lb

## 2018-01-11 DIAGNOSIS — R52 Pain, unspecified: Secondary | ICD-10-CM | POA: Diagnosis not present

## 2018-01-11 DIAGNOSIS — R6889 Other general symptoms and signs: Secondary | ICD-10-CM | POA: Diagnosis not present

## 2018-01-11 NOTE — Patient Instructions (Addendum)
     IF you received an x-ray today, you will receive an invoice from [facility]. Please contact [facility] at [phone] with questions or concerns regarding your invoice.   IF you received labwork today, you will receive an invoice from [facility]. Please contact [facility] at [phone] with questions or concerns regarding your invoice.   Our billing staff will not be able to assist you with questions regarding bills from these companies.  You will be contacted with the lab results as soon as they are available. The fastest way to get your results is to activate your My Chart account. Instructions are located on the last page of this paperwork. If you have not heard from us regarding the results in 2 weeks, please contact this office.     Upper Respiratory Infection, Adult Most upper respiratory infections (URIs) are caused by a virus. A URI affects the nose, throat, and upper air passages. The most common type of URI is often called "the common cold." Follow these instructions at home:  Take medicines only as told by your doctor.  Gargle warm saltwater or take cough drops to comfort your throat as told by your doctor.  Use a warm mist humidifier or inhale steam from a shower to increase air moisture. This may make it easier to breathe.  Drink enough fluid to keep your pee (urine) clear or pale yellow.  Eat soups and other clear broths.  Have a healthy diet.  Rest as needed.  Go back to work when your fever is gone or your doctor says it is okay. ? You may need to stay home longer to avoid giving your URI to others. ? You can also wear a face mask and wash your hands often to prevent spread of the virus.  Use your inhaler more if you have asthma.  Do not use any tobacco products, including cigarettes, chewing tobacco, or electronic cigarettes. If you need help quitting, ask your doctor. Contact a doctor if:  You are getting worse, not better.  Your  symptoms are not helped by medicine.  You have chills.  You are getting more short of breath.  You have brown or red mucus.  You have yellow or brown discharge from your nose.  You have pain in your face, especially when you bend forward.  You have a fever.  You have puffy (swollen) neck glands.  You have pain while swallowing.  You have white areas in the back of your throat. Get help right away if:  You have very bad or constant: ? Headache. ? Ear pain. ? Pain in your forehead, behind your eyes, and over your cheekbones (sinus pain). ? Chest pain.  You have long-lasting (chronic) lung disease and any of the following: ? Wheezing. ? Long-lasting cough. ? Coughing up blood. ? A change in your usual mucus.  You have a stiff neck.  You have changes in your: ? Vision. ? Hearing. ? Thinking. ? Mood. This information is not intended to replace advice given to you by your health care provider. Make sure you discuss any questions you have with your health care provider. Document Released: 05/03/2008 Document Revised: 07/18/2016 Document Reviewed: 02/20/2014 Elsevier Interactive Patient Education  2018 Elsevier Inc.  
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
## 2018-02-28 ENCOUNTER — Encounter: Payer: Self-pay | Admitting: Physician Assistant

## 2018-07-15 IMAGING — RF DG HYSTEROGRAM
6 of 8 series · 17 of 24 positions shown · IV contrast (omnipaque)
Comparison: None.

CLINICAL DATA: Primary infertility.

EXAM:
HYSTEROSALPINGOGRAM
TECHNIQUE: Following cleansing of the cervix and vagina with Betadine solution,
a hysterosalpingogram was performed using a 5-French
hysterosalpingogram catheter and Omnipaque 300 contrast. The patient
tolerated the examination without difficulty.

[Series 1: run · 1 of 1 slices shown (1 of 6)]
[im 1/1]
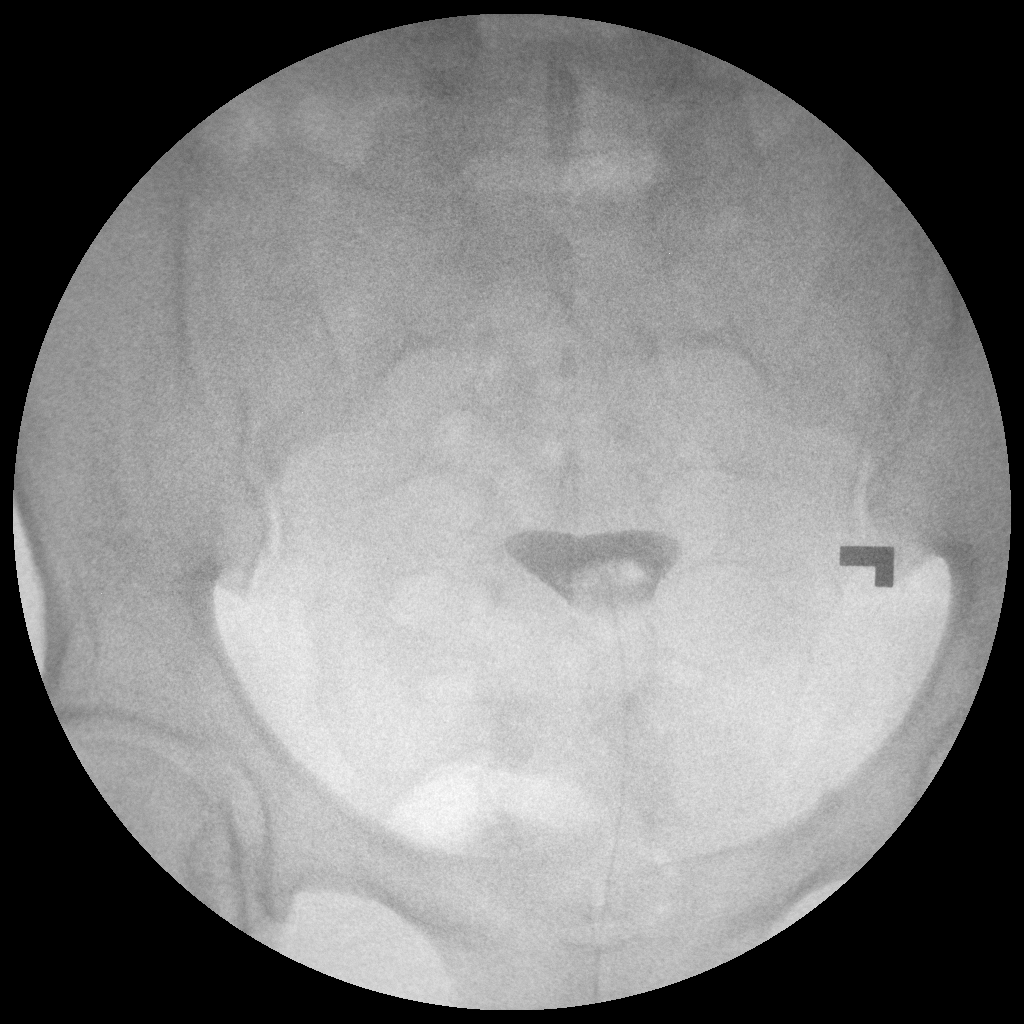

[Series 3: run · 1 of 1 slices shown (2 of 6)]
[im 1/1]
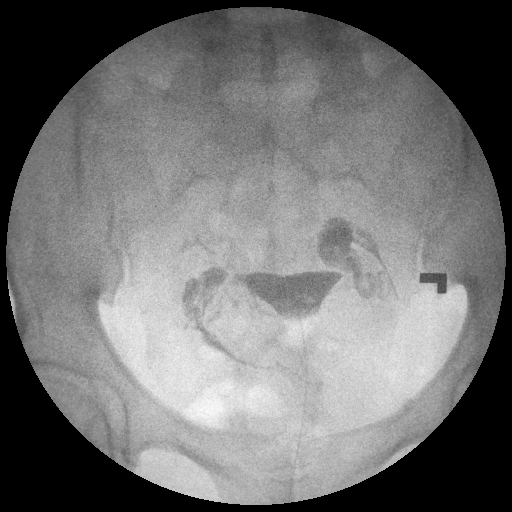

[Series 4: run · 1 of 1 slices shown (3 of 6)]
[im 1/1]
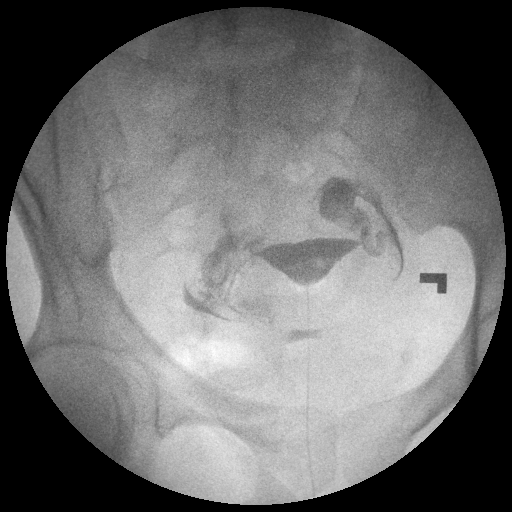

[Series 5: run · 1 of 1 slices shown (4 of 6)]
[im 1/1]
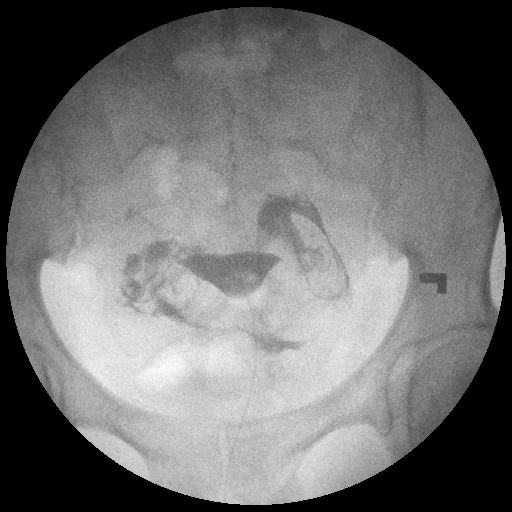

[Series 7: run · 12 of 18 slices shown (5 of 6)]
[im 1/18]
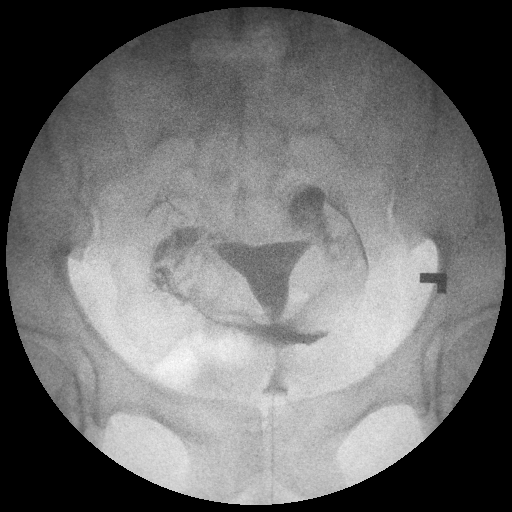
[im 2/18]
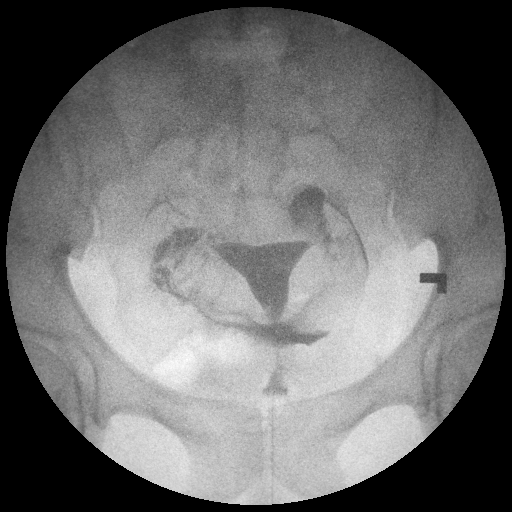
[im 4/18]
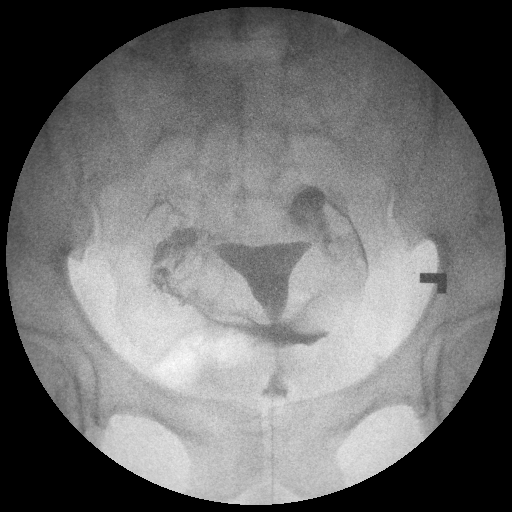
[im 5/18]
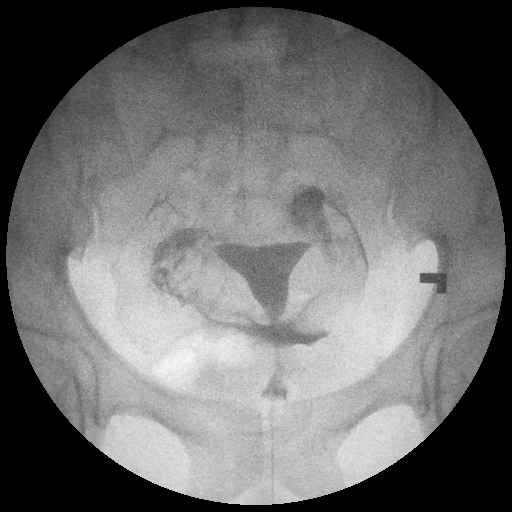
[im 7/18]
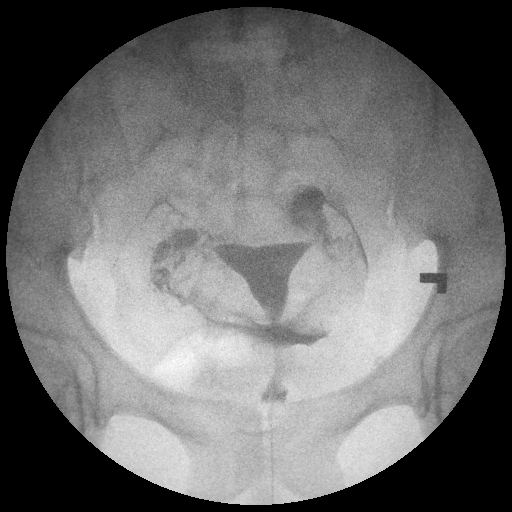
[im 8/18]
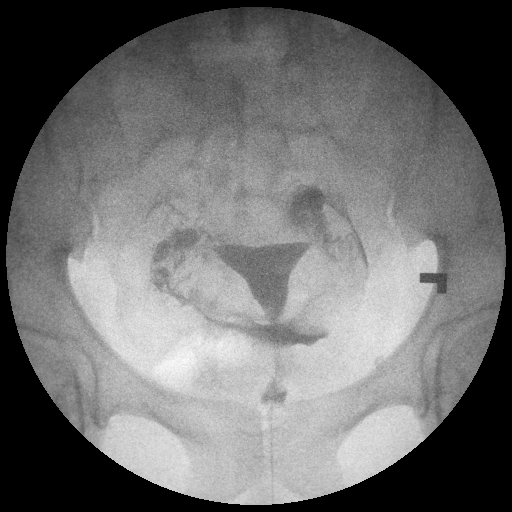
[im 9/18]
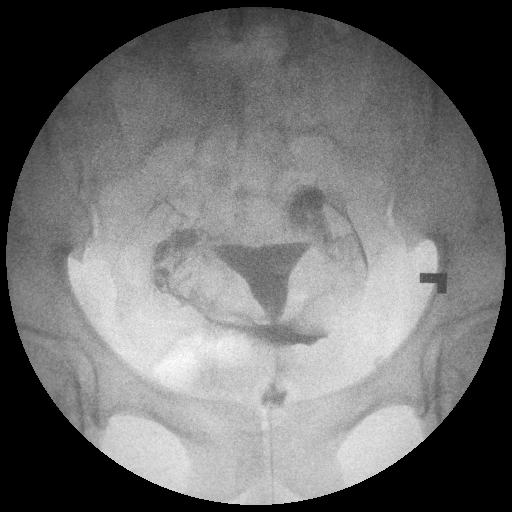
[im 11/18]
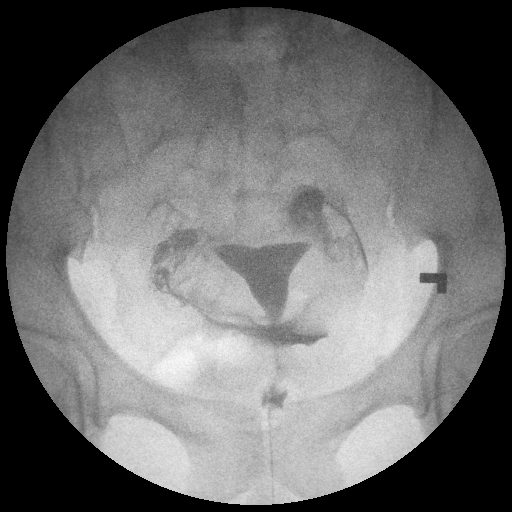
[im 12/18]
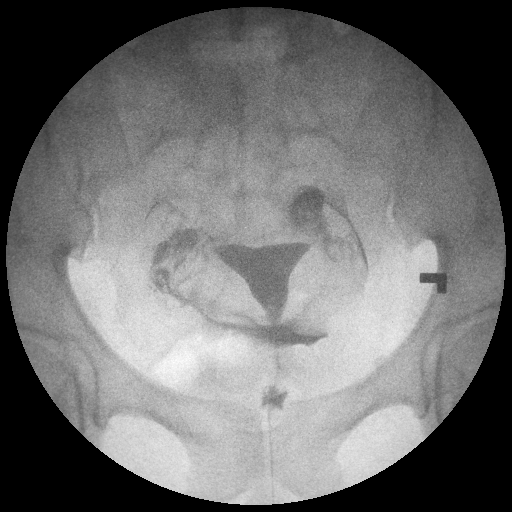
[im 14/18]
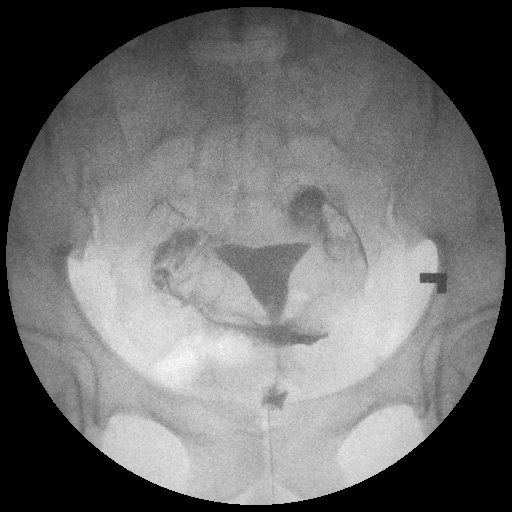
[im 15/18]
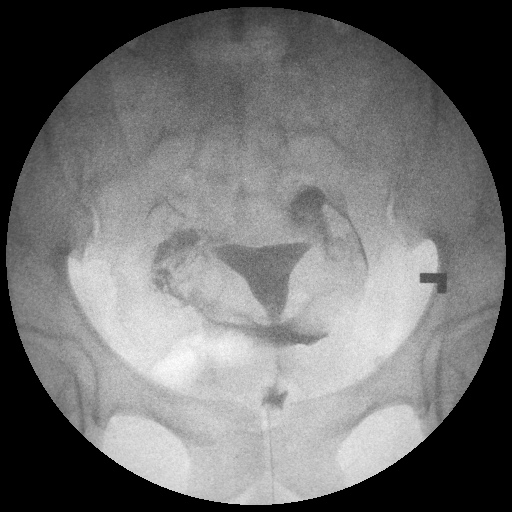
[im 16/18]
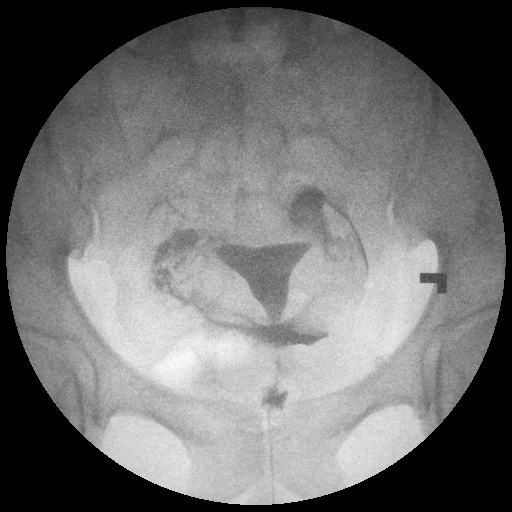

[Series 8: run · 1 of 1 slices shown (6 of 6)]
[im 1/1]
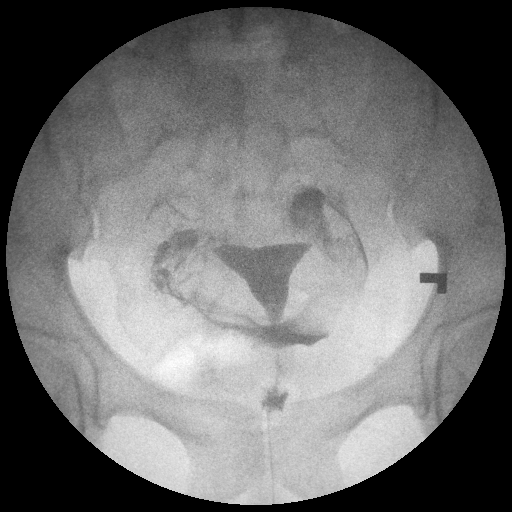

[17 of 24 positions shown; findings below may reference images not displayed]

FLUOROSCOPY TIME:  Fluoroscopy Time: 0 minutes 54 seconds ; low dose
pulsed fluoroscopy

Number of Acquired Images:  0
FINDINGS: Endometrial Cavity: Normal appearance. No signs of Mullerian duct
anomaly or other significant abnormality.

Right Fallopian Tube: Normal appearance. Free intraperitoneal spill
of contrast is demonstrated.

Left Fallopian Tube: Normal appearance. Free intraperitoneal spill
of contrast is demonstrated.

Other:  None.
IMPRESSION: Normal study. Both fallopian tubes are patent.

## 2019-06-22 ENCOUNTER — Other Ambulatory Visit: Payer: Self-pay

## 2020-02-05 ENCOUNTER — Emergency Department (HOSPITAL_BASED_OUTPATIENT_CLINIC_OR_DEPARTMENT_OTHER)
Admission: EM | Admit: 2020-02-05 | Discharge: 2020-02-05 | Disposition: A | Discharge status: Home / Self Care | Payer: Self-pay | Attending: Emergency Medicine | Admitting: Emergency Medicine

## 2020-02-05 ENCOUNTER — Encounter (HOSPITAL_BASED_OUTPATIENT_CLINIC_OR_DEPARTMENT_OTHER): Payer: Self-pay | Admitting: *Deleted

## 2020-02-05 ENCOUNTER — Other Ambulatory Visit: Payer: Self-pay

## 2020-02-05 DIAGNOSIS — Z7982 Long term (current) use of aspirin: Secondary | ICD-10-CM | POA: Diagnosis not present

## 2020-02-05 DIAGNOSIS — Y999 Unspecified external cause status: Secondary | ICD-10-CM | POA: Insufficient documentation

## 2020-02-05 DIAGNOSIS — S161XXA Strain of muscle, fascia and tendon at neck level, initial encounter: Secondary | ICD-10-CM | POA: Diagnosis not present

## 2020-02-05 DIAGNOSIS — Y92410 Unspecified street and highway as the place of occurrence of the external cause: Secondary | ICD-10-CM | POA: Diagnosis not present

## 2020-02-05 DIAGNOSIS — Y9389 Activity, other specified: Secondary | ICD-10-CM | POA: Insufficient documentation

## 2020-02-05 DIAGNOSIS — S199XXA Unspecified injury of neck, initial encounter: Secondary | ICD-10-CM | POA: Diagnosis present

## 2020-02-05 MED ORDER — CYCLOBENZAPRINE HCL 10 MG PO TABS: 10.0000 mg | ORAL_TABLET | Freq: Two times a day (BID) | ORAL | 0 refills | Status: AC | PRN

## 2020-02-05 MED ORDER — IBUPROFEN 600 MG PO TABS
600.0000 mg | ORAL_TABLET | Freq: Four times a day (QID) | ORAL | 0 refills | Status: AC | PRN
Start: 2020-02-05 — End: ?

## 2020-02-05 MED FILL — IBUPROFEN 600 MG TABLET: 600 | 8 days supply | Qty: 30 | Fill #0

## 2020-02-05 MED FILL — CYCLOBENZAPRINE HCL 10 MG T: 10 | 6 days supply | Qty: 12 | Fill #0
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
## 2020-02-05 NOTE — Discharge Instructions (Signed)
You should  call to schedule follow-up appointment with your primary care provider in 1 to 2 weeks.  If your symptoms are not getting better by the time you are seen in the office, your doctor may wish to pursue further imaging such as a CT scan or an MRI of your upper spine.  I did not feel there is a need to do this in the ER today, as I felt this was likely muscular pain and spasms you were suffering from.  I felt a trial of conservative management with motrin and muscle relaxers (also heating pads, gentle massage at home), would be a good first step.
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
## 2020-12-31 ENCOUNTER — Other Ambulatory Visit: Payer: Self-pay | Admitting: Cardiology

## 2021-01-01 ENCOUNTER — Other Ambulatory Visit: Payer: Self-pay | Admitting: Cardiology

## 2021-03-27 ENCOUNTER — Ambulatory Visit (HOSPITAL_COMMUNITY)
Admission: EM | Admit: 2021-03-27 | Discharge: 2021-03-27 | Disposition: A | Discharge status: Other Acute Inpatient Hospital | Payer: No Typology Code available for payment source

## 2021-04-15 ENCOUNTER — Other Ambulatory Visit: Payer: Self-pay | Admitting: Nurse Practitioner

## 2021-05-01 ENCOUNTER — Other Ambulatory Visit: Payer: Self-pay | Admitting: Nurse Practitioner

## 2021-05-01 DIAGNOSIS — R7989 Other specified abnormal findings of blood chemistry: Secondary | ICD-10-CM
